# Patient Record
Sex: Male | Born: 1945 | Race: Black or African American | Hispanic: No | Marital: Single | Smoking: Never smoker
Health system: Southern US, Community
[De-identification: ages and names within clinical notes are randomized; demographics above are authoritative.]

## PROBLEM LIST (undated history)

## (undated) DIAGNOSIS — E119 Type 2 diabetes mellitus without complications: Secondary | ICD-10-CM

## (undated) DIAGNOSIS — K219 Gastro-esophageal reflux disease without esophagitis: Secondary | ICD-10-CM

## (undated) DIAGNOSIS — E7800 Pure hypercholesterolemia, unspecified: Secondary | ICD-10-CM

---

## 2021-09-21 ENCOUNTER — Emergency Department
Admission: EM | Admit: 2021-09-21 | Discharge: 2021-09-21 | Disposition: A | Discharge status: Home / Self Care | Payer: Medicare Other | Attending: Emergency Medicine | Admitting: Emergency Medicine

## 2021-09-21 ENCOUNTER — Emergency Department: Payer: Medicare Other

## 2021-09-21 ENCOUNTER — Other Ambulatory Visit: Payer: Self-pay

## 2021-09-21 DIAGNOSIS — R42 Dizziness and giddiness: Secondary | ICD-10-CM | POA: Diagnosis present

## 2021-09-21 LAB — URINALYSIS, ROUTINE W REFLEX MICROSCOPIC
Bilirubin Urine: NEGATIVE
Glucose, UA: NEGATIVE mg/dL
Hgb urine dipstick: NEGATIVE
Ketones, ur: NEGATIVE mg/dL
Leukocytes,Ua: NEGATIVE
Nitrite: NEGATIVE
Protein, ur: NEGATIVE mg/dL
Specific Gravity, Urine: 1.02 (ref 1.005–1.030)
pH: 6.5 (ref 5.0–8.0)

## 2021-09-21 LAB — BASIC METABOLIC PANEL
Anion gap: 9 (ref 5–15)
BUN: 12 mg/dL (ref 8–23)
CO2: 21 mmol/L — ABNORMAL LOW (ref 22–32)
Calcium: 9.6 mg/dL (ref 8.9–10.3)
Chloride: 108 mmol/L (ref 98–111)
Creatinine, Ser: 0.92 mg/dL (ref 0.61–1.24)
GFR, Estimated: >60 mL/min (ref >60)
Glucose, Bld: 113 mg/dL — ABNORMAL HIGH (ref 70–99)
Potassium: 3.8 mmol/L (ref 3.5–5.1)
Sodium: 138 mmol/L (ref 135–145)

## 2021-09-21 LAB — CBC
HCT: 39.9 % (ref 39.0–52.0)
Hemoglobin: 13.2 g/dL (ref 13.0–17.0)
MCH: 31.5 pg (ref 26.0–34.0)
MCHC: 33.1 g/dL (ref 30.0–36.0)
MCV: 95.2 fL (ref 80.0–100.0)
Platelets: 229 10*3/uL (ref 150–400)
RBC: 4.19 MIL/uL — ABNORMAL LOW (ref 4.22–5.81)
RDW: 13.6 % (ref 11.5–15.5)
WBC: 6.8 10*3/uL (ref 4.0–10.5)
nRBC: 0 % (ref 0.0–0.2)

## 2021-09-21 IMAGING — MR MR HEAD W/O CM
12 series · 48 of 48 positions shown · non-contrast
Comparison: None.

CLINICAL DATA: Nonspecific dizziness.  Room spinning for 3 days.

EXAM:
MRI HEAD WITHOUT CONTRAST
TECHNIQUE: Multiplanar, multiecho pulse sequences of the brain and surrounding
structures were obtained without intravenous contrast.

[Series 5: ax dwi_tracew · axial · 3.0mm · 0.65mm/px · z∈[-113,+55]mm · 4 of 52 slices shown]
[im 1/52]
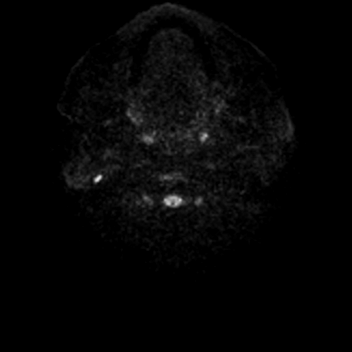
[im 18/52]
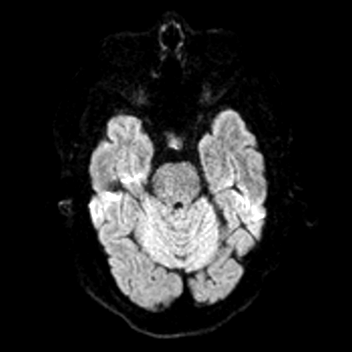
[im 35/52]
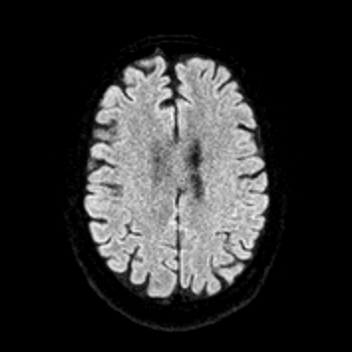
[im 52/52]
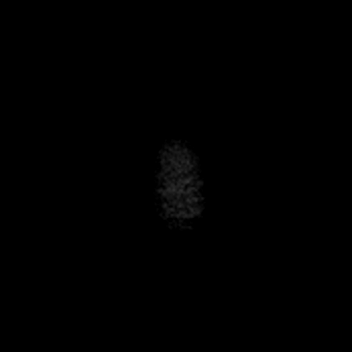

[Series 6: ax dwi_adc · axial · 3.0mm · 0.65mm/px · z∈[-113,+55]mm · 4 of 52 slices shown]
[im 1/52]
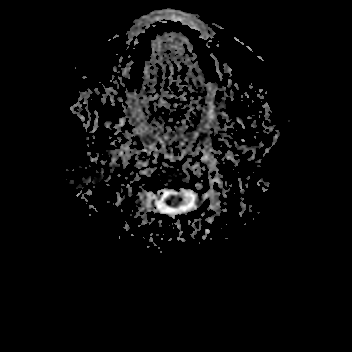
[im 18/52]
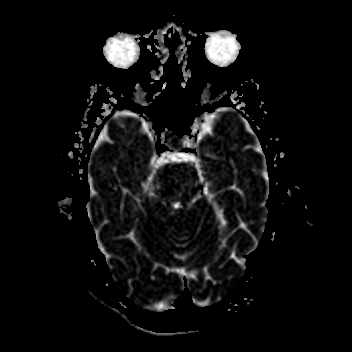
[im 35/52]
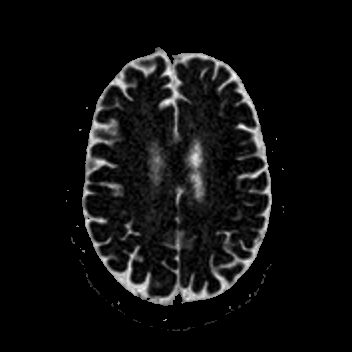
[im 52/52]
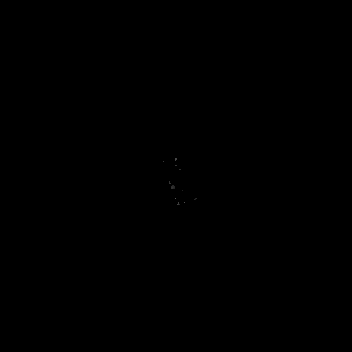

[Series 7: cor dwi_tracew · coronal · 5.0mm · 0.65mm/px · 3 of 42 slices shown]
[im 1/42]
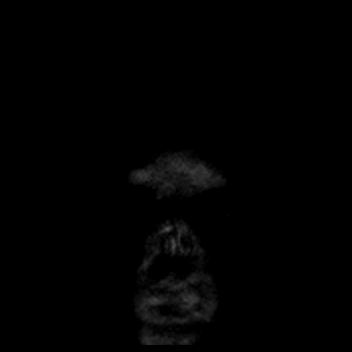
[im 21/42]
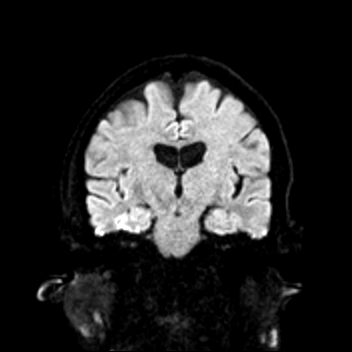
[im 42/42]
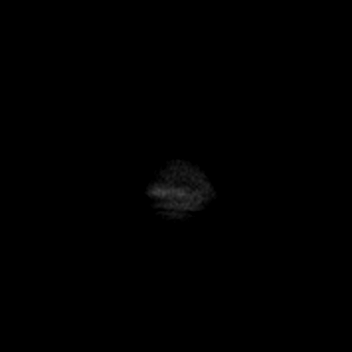

[Series 8: cor dwi_adc · coronal · 5.0mm · 0.65mm/px · 3 of 40 slices shown]
[im 1/40]
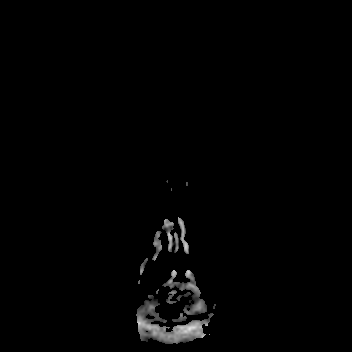
[im 20/40]
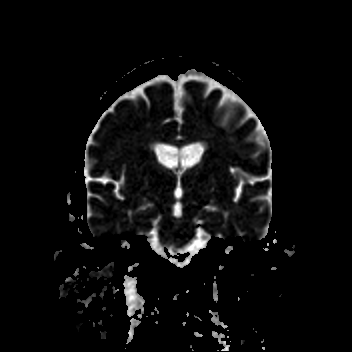
[im 40/40]
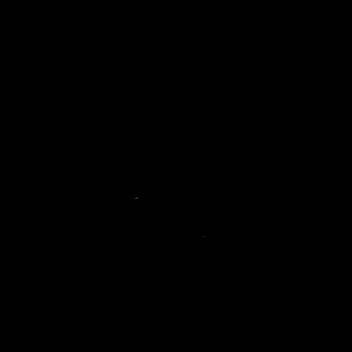

[Series 9: T1 · sagittal · 5.0mm · 0.62mm/px · 2 of 23 slices shown (1 of 2)]
[im 1/23]
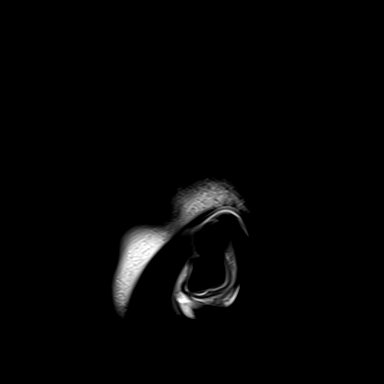
[im 23/23]
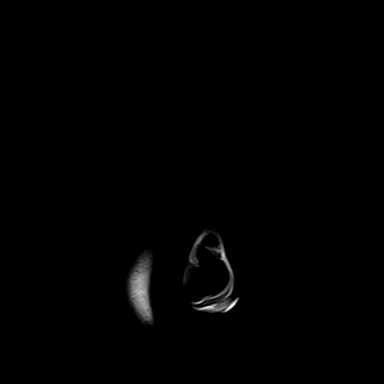

[Series 10: T2 · axial · 5.0mm · 0.53mm/px · z∈[-101,+55]mm · 2 of 27 slices shown (1 of 2)]
[im 1/27]
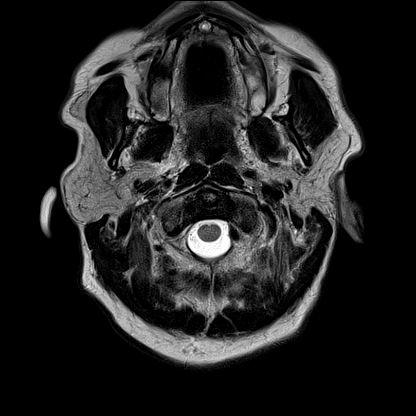
[im 27/27]
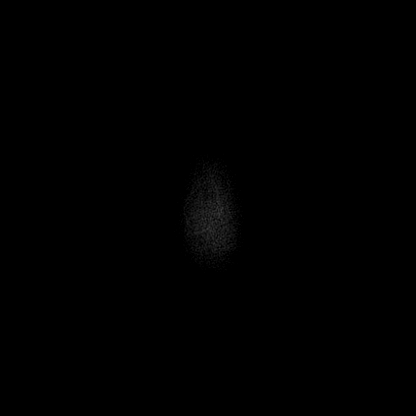

[Series 11: mag_images · axial · 3.0mm · 0.90mm/px · z∈[-111,+66]mm · 4 of 60 slices shown]
[im 1/60]
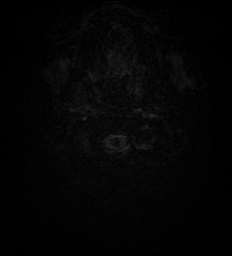
[im 20/60]
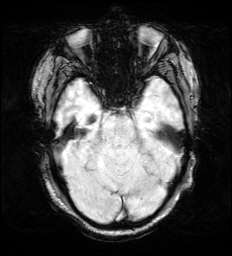
[im 40/60]
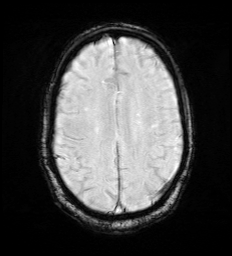
[im 60/60]
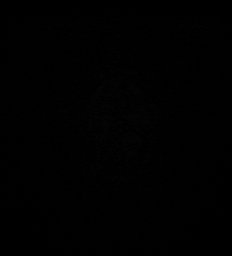

[Series 12: pha_images · axial · 3.0mm · 0.90mm/px · z∈[-111,+66]mm · 4 of 60 slices shown]
[im 1/60]
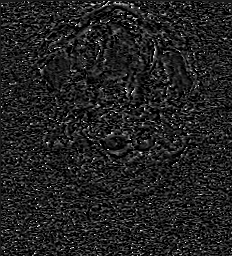
[im 20/60]
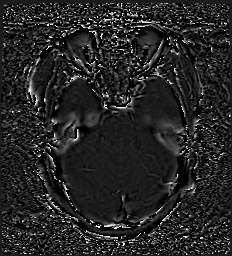
[im 40/60]
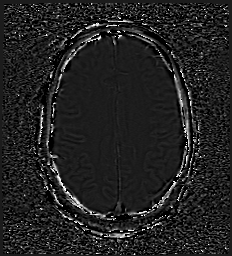
[im 60/60]
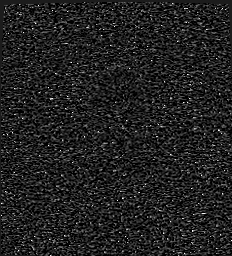

[Series 13: swi_images · axial · 3.0mm · 0.90mm/px · z∈[-111,+66]mm · 4 of 60 slices shown]
[im 1/60]
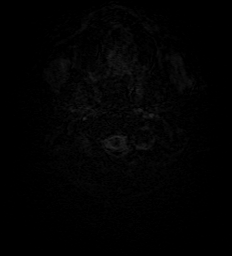
[im 20/60]
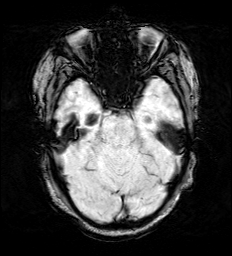
[im 40/60]
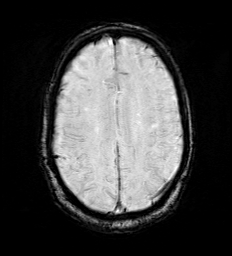
[im 60/60]
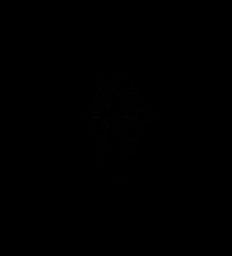

[Series 15: FLAIR · axial · 3.0mm · 0.53mm/px · z∈[-104,+58]mm · 4 of 55 slices shown]
[im 1/55]
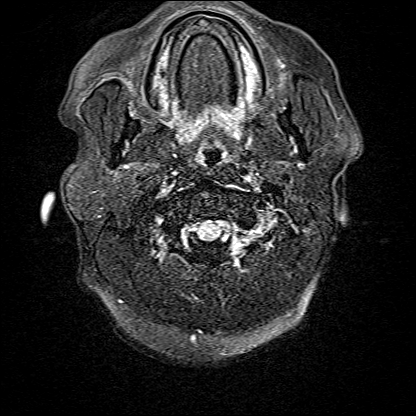
[im 19/55]
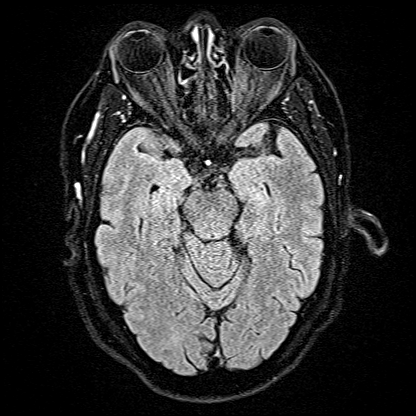
[im 37/55]
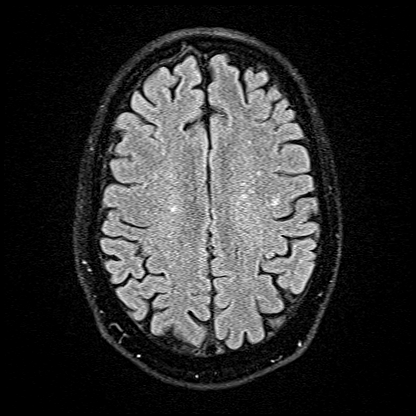
[im 55/55]
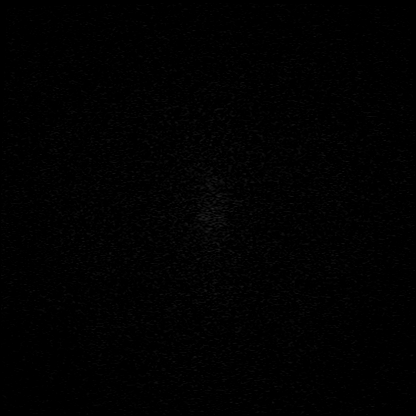

[Series 16: T1 · axial · 1.0mm · 0.98mm/px · z∈[-113,+62]mm · 12 of 176 slices shown (2 of 2)]
[im 1/176]
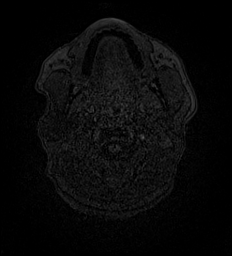
[im 16/176]
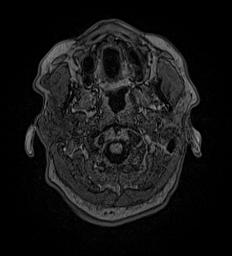
[im 32/176]
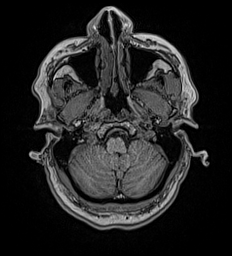
[im 48/176]
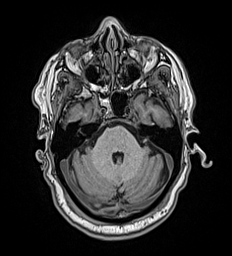
[im 64/176]
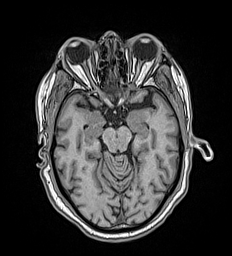
[im 80/176]
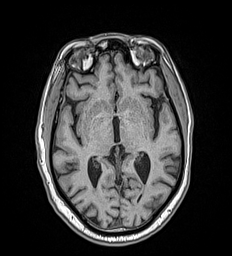
[im 96/176]
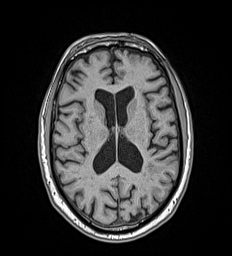
[im 112/176]
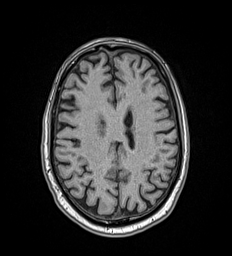
[im 128/176]
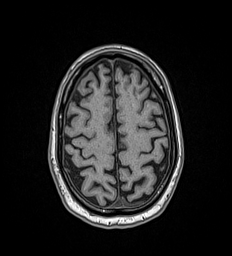
[im 144/176]
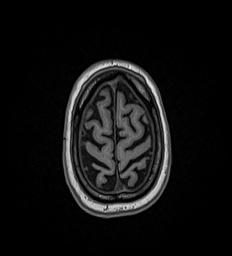
[im 160/176]
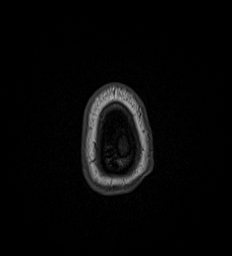
[im 176/176]
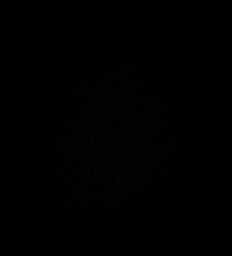

[Series 17: T2 · coronal · 5.0mm · 0.57mm/px · 2 of 31 slices shown (2 of 2)]
[im 1/31]
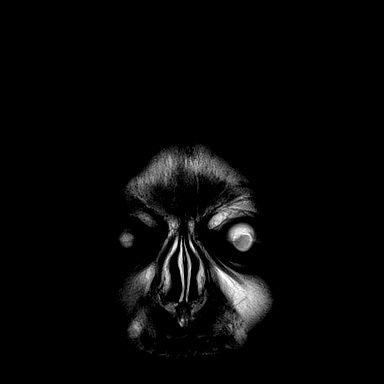
[im 31/31]
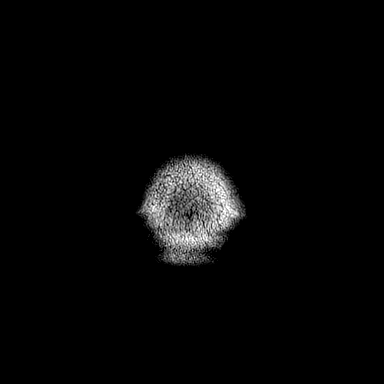

[48 of 48 positions shown; findings below may reference images not displayed]

FINDINGS: Brain: No acute infarction, hemorrhage, hydrocephalus, extra-axial
collection or mass lesion. Mild FLAIR hyperintensity in the cerebral
white matter, asymmetric to the left hemisphere. This is most likely
chronic small vessel ischemia given patient age and mild extent.
Brain volume is normal.Chronic mineralization in the right
cerebellum which is non specific in isolation.

Vascular: Normal flow voids.

Skull and upper cervical spine: Normal marrow signal.

Sinuses/Orbits: Negative.
IMPRESSION: 1. No acute finding.
2. Mild chronic white matter disease.

## 2021-09-21 IMAGING — CT CT HEAD W/O CM
4 series · 17 of 47 positions shown, 19 images · non-contrast
Comparison: None.

CLINICAL DATA: Mental status change



[Series 2: head wo · axial · 0.45mm/px · z∈[-75,+45]mm · 7 of 32 slices shown, 9 images]
[im 4/32  brain]
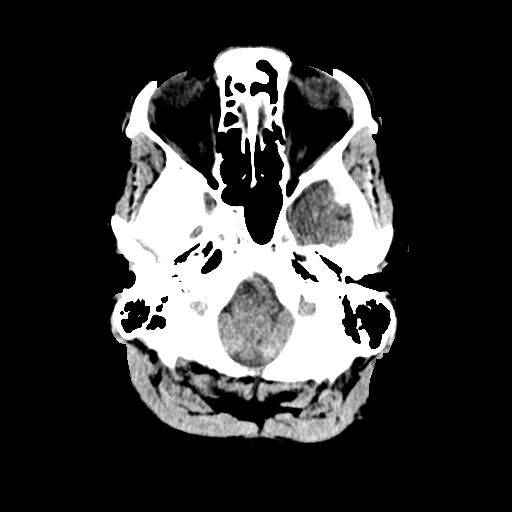
[im 4/32  bone]
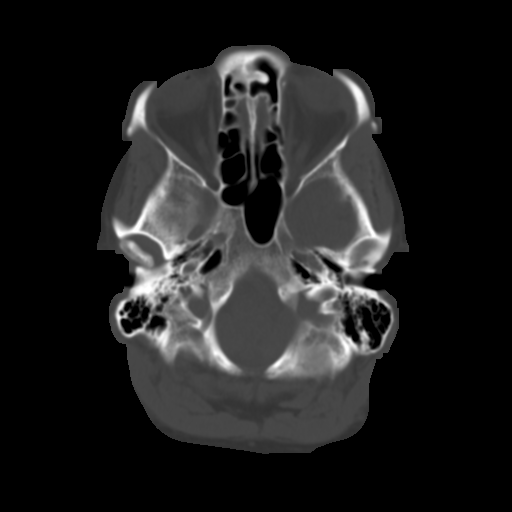
[im 8/32  brain]
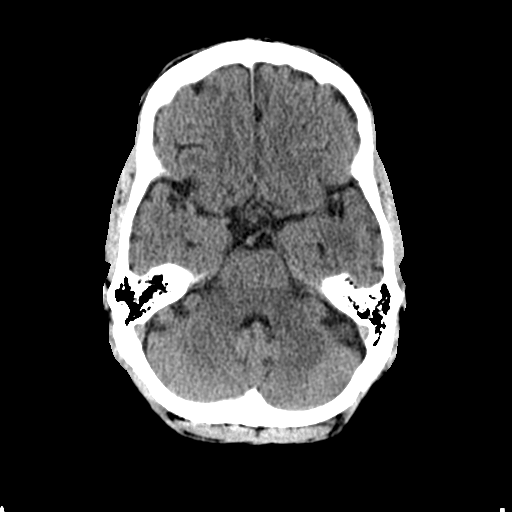
[im 12/32  brain]
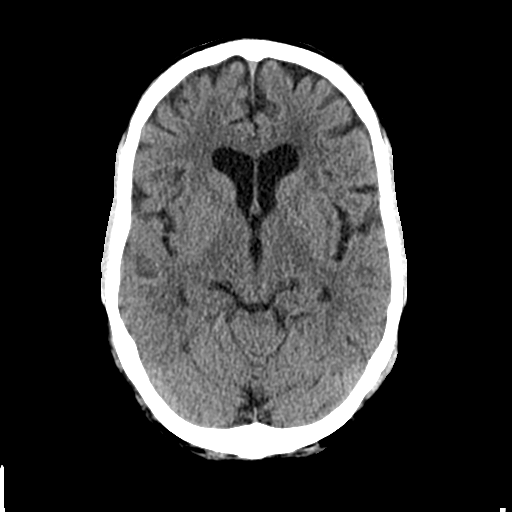
[im 16/32  brain]
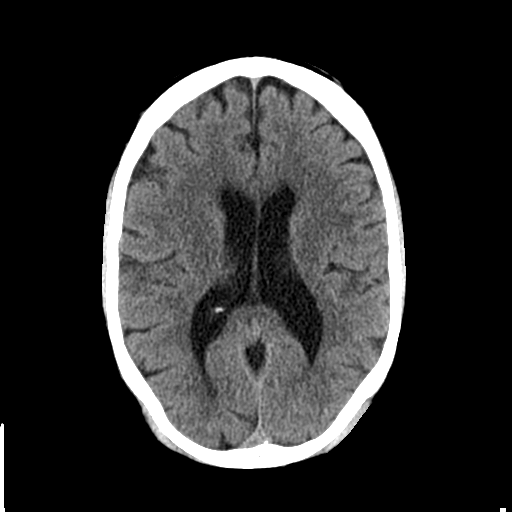
[im 20/32  brain]
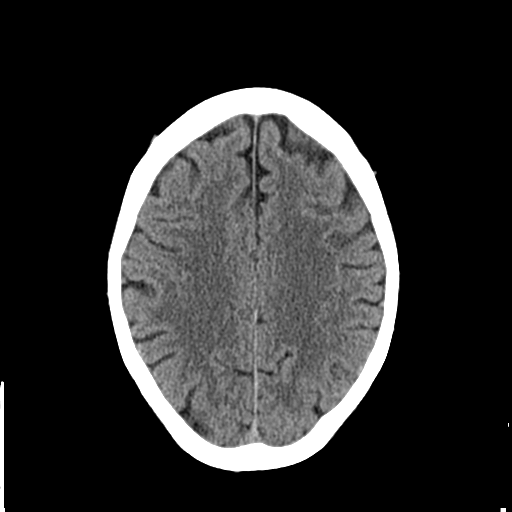
[im 20/32  bone]
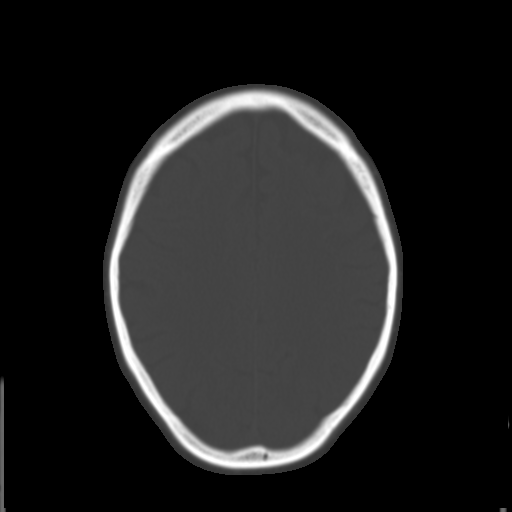
[im 24/32  brain]
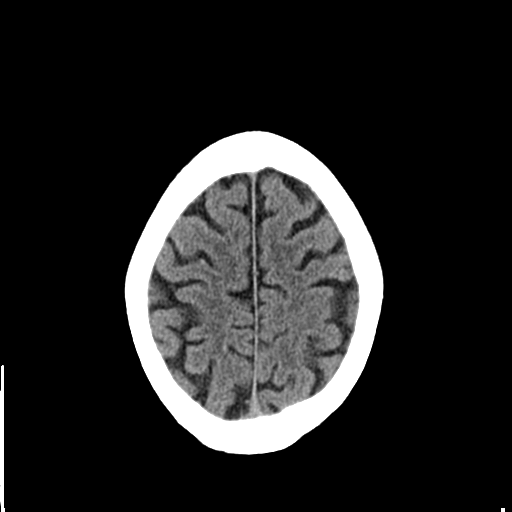
[im 28/32  brain]
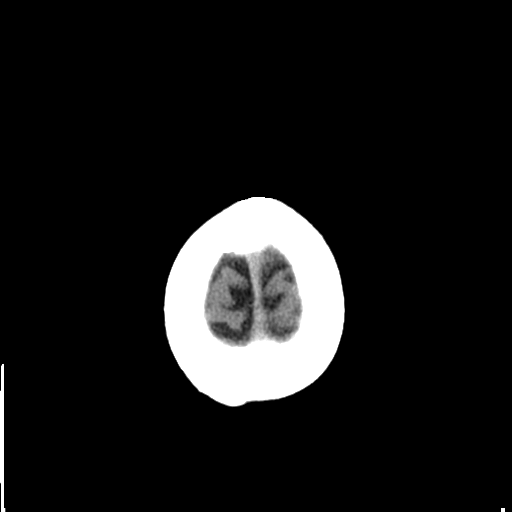

[Series 3: head bone · axial · 0.45mm/px · z∈[-76,-20]mm · 4 of 79 slices shown]
[im 8/79  bone]
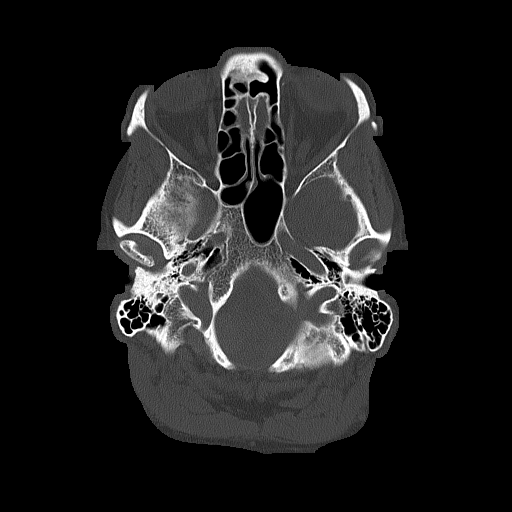
[im 16/79  bone]
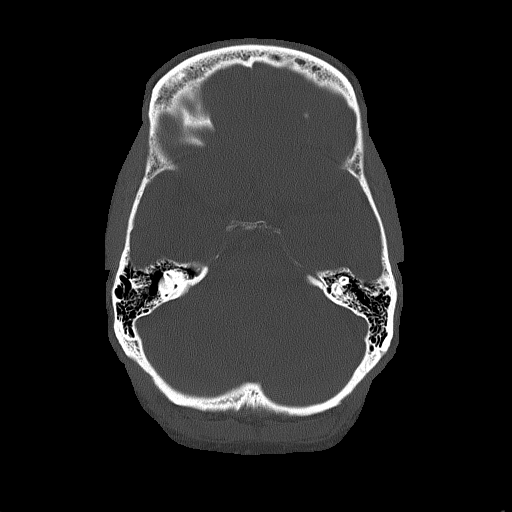
[im 24/79  bone]
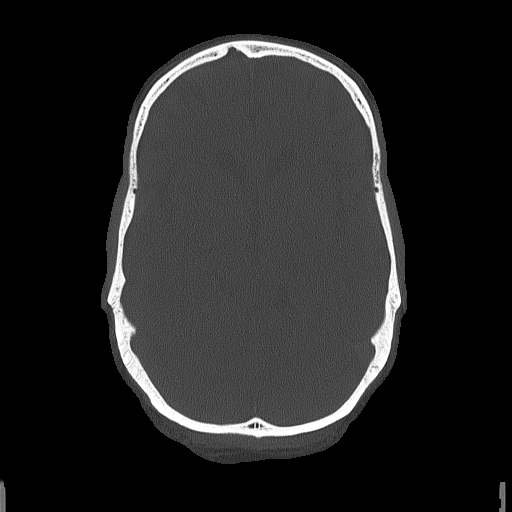
[im 36/79  bone]
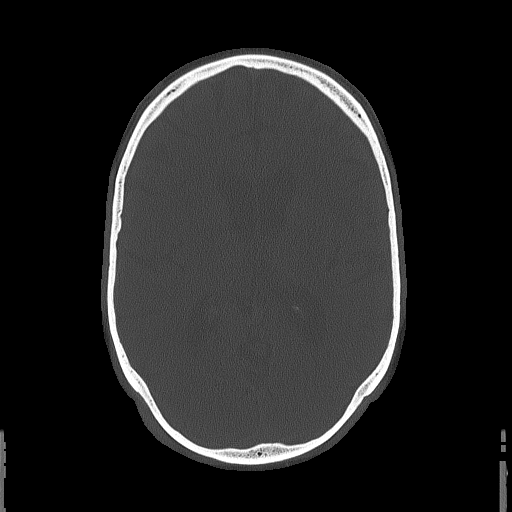

[Series 4: coronal soft tissue · coronal · 0.35mm/px · 3 of 69 slices shown]
[im 23/69  brain]
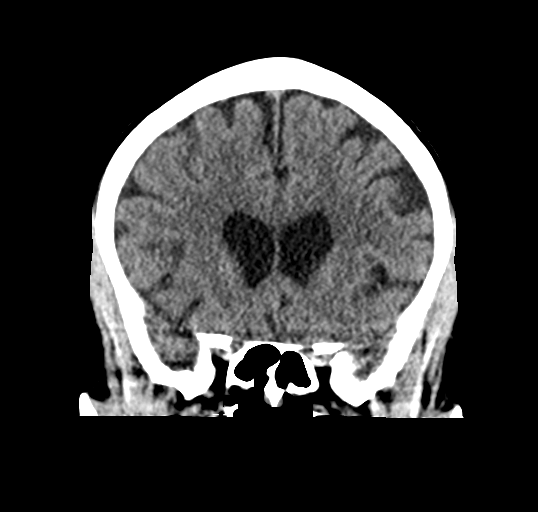
[im 31/69  brain]
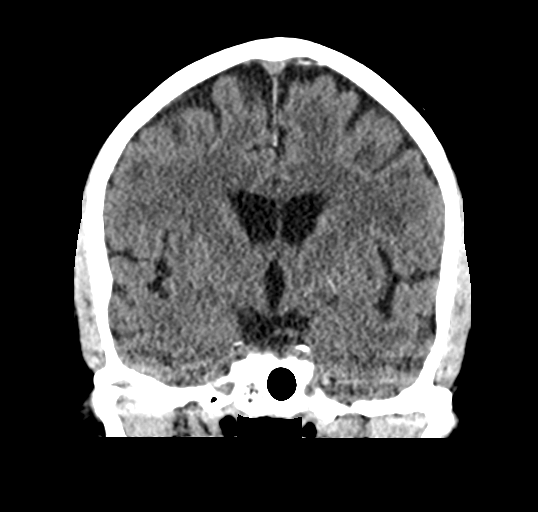
[im 38/69  brain]
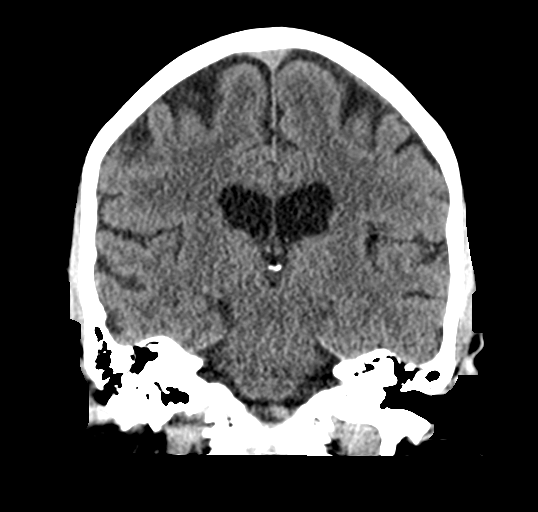

[Series 5: sagittal soft tissue · sagittal · 0.35mm/px · 3 of 53 slices shown]
[im 18/53  brain]
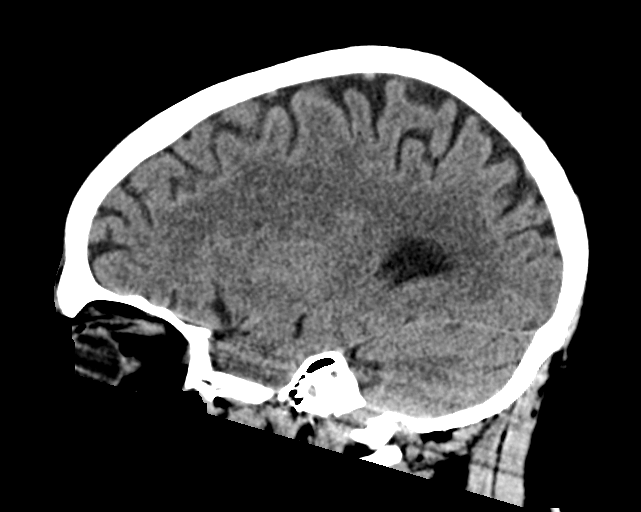
[im 27/53  brain]
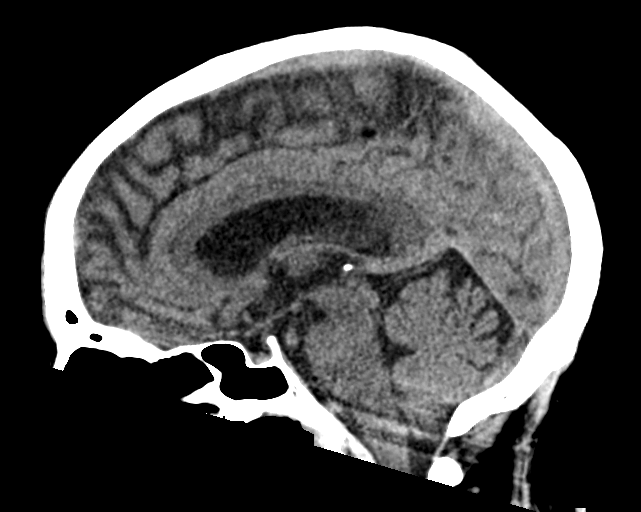
[im 35/53  brain]
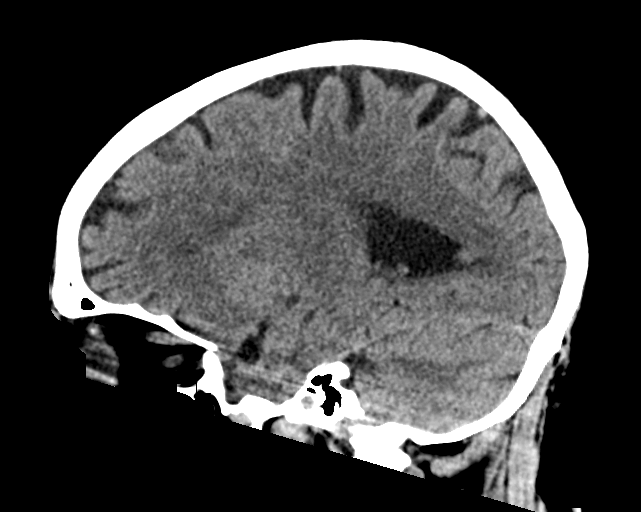

[17 of 47 positions shown; findings below may reference images not displayed]

FINDINGS: Brain: Mild age related global atrophy. No evidence of acute
infarction, hemorrhage, hydrocephalus, extra-axial collection or
mass lesion/mass effect.

Vascular: No hyperdense vessel or unexpected calcification.

Skull: Normal. Negative for fracture or focal lesion.

Sinuses/Orbits: No acute finding.

Other: None.
IMPRESSION: No acute intracranial abnormality.

## 2021-09-21 MED ORDER — MECLIZINE HCL 25 MG PO TABS
25.0000 mg | ORAL_TABLET | Freq: Once | ORAL | Status: AC
  Administered 2021-09-21: 25 mg via ORAL
  Filled 2021-09-21: qty 1

## 2021-09-21 MED ORDER — MECLIZINE HCL 25 MG PO TABS: 25.0000 mg | ORAL_TABLET | Freq: Three times a day (TID) | ORAL | 0 refills | Status: DC | PRN

## 2021-09-21 NOTE — ED Provider Notes (Signed)
[facility] Provider Note    Event Date/Time   First MD Initiated Contact with Patient 09/21/21 [phone]     (approximate)  History   Chief Complaint: Dizziness  HPI  Alex Lin is a 75 y.o. male presents to the emergency department for dizziness.  According to the patient over the past 2 days he has been experiencing intermittent episodes of significant dizziness which she describes as a room spinning sensation.  Patient denies any headache weakness numbness confusion or slurred speech.  States the episodes are somewhat intermittent and seem to be provoked by movement.  Patient states he has never been diagnosed with vertigo but states he has been told he has a "equilibrium" issue in the past that presented very similarly.  Physical Exam   Triage Vital Signs: ED Triage Vitals  Enc Vitals Group     BP 09/21/21 0924 137/88     Pulse Rate 09/21/21 0924 67     Resp 09/21/21 0924 20     Temp 09/21/21 0924 98.8 F (37.1 C)     Temp src --      SpO2 09/21/21 0924 98 %     Weight --      Height --      Head Circumference --      Peak Flow --      Pain Score 09/21/21 0946 8     Pain Loc --      Pain Edu? --      Excl. in GC? --     Most recent vital signs: Vitals:   09/21/21 0924 09/21/21 0946  BP: 137/88 (!) 158/83  Pulse: 67 (!) 59  Resp: 20 16  Temp: 98.8 F (37.1 C)   SpO2: 98% 99%    General: Awake, no distress.  CV:  Good peripheral perfusion.  Regular rate and rhythm  Resp:  Normal effort.  Equal breath sounds bilaterally.  Abd:  No distention.  Soft, nontender.  No rebound or guarding. Other:  Equal grip strengths bilaterally.  Cranial nerves intact.   ED Results / Procedures / Treatments   EKG  EKG viewed and interpreted by myself shows a normal sinus rhythm at 70 bpm with a narrow QRS, normal axis, normal intervals, no concerning ST changes.  RADIOLOGY  I have personally reviewed the CT images the head, no acute abnormality  my evaluation. Radiology is read the CT is negative. MRI is negative   MEDICATIONS ORDERED IN ED: Medications - No data to display   IMPRESSION / MDM / ASSESSMENT AND PLAN / ED COURSE  I reviewed the triage vital signs and the nursing notes.  Patient presents to the emergency department for dizziness over the past 2 days but worse this morning.  [facility] to [facility] and was referred to the emergency department.  Patient states at times he has difficulty walking and feels unsteady which he describes as being off balance.  States negative flu and COVID test done at [facility] earlier today.  Patient is differential would include vertigo, electrolyte or metabolic abnormality, dehydration, CVA.  We will check labs, IV hydrate.  I discussed with the patient pros and cons of proceeding with MRI patient is agreeable.  We will MRI to rule out CVA.  Patient's labs are reassuring.  Urinalysis is normal.  Chemistry is normal including normal renal function.  Normal white blood cell count and blood counts.  Patient CT scan of the head is negative, MRI of the brain  is negative.  Highly suspect vertigo to be the cause of the patient's dizziness.  He states he is feeling much better after meclizine.  We will discharge with the same have the patient follow-up with his doctor.  I discussed return precautions.  FINAL CLINICAL IMPRESSION(S) / ED DIAGNOSES   Dizziness  Rx / DC Orders   Meclizine  Note:  This document was prepared using Dragon voice recognition software and may include unintentional dictation errors.   Minna Antis, MD 09/21/21 1413

## 2021-09-21 NOTE — ED Triage Notes (Addendum)
Pt sent from [facility] with c/o dizziness with HA and nausea for the past 3-4 days. Pt has an unsteady gait on arrival, denies any visual changes.  Had a negative flu and covid test today

## 2021-09-21 NOTE — ED Notes (Signed)
Pt to CT

## 2021-09-21 NOTE — ED Notes (Signed)
Phone provided for MRI screening. 

## 2021-09-21 NOTE — ED Notes (Signed)
Dr. Lenard Lance at bedside with pt. Pt advised he has been having dizziness and the room was spinning for the past 3 days. The pt denied hx of vertigo, recent falls, or illness. The pt c/o a headache to the front of his forehead and a cough with clear sputum. Wife at bedside.

## 2021-09-21 NOTE — ED Notes (Signed)
Complains of H/A. Still a little dizzy

## 2022-02-07 ENCOUNTER — Other Ambulatory Visit: Payer: Self-pay

## 2022-02-07 ENCOUNTER — Emergency Department: Payer: Medicare Other

## 2022-02-07 ENCOUNTER — Emergency Department
Admission: EM | Admit: 2022-02-07 | Discharge: 2022-02-07 | Disposition: A | Discharge status: Home / Self Care | Payer: Medicare Other | Attending: Student in an Organized Health Care Education/Training Program | Admitting: Student in an Organized Health Care Education/Training Program

## 2022-02-07 DIAGNOSIS — R531 Weakness: Secondary | ICD-10-CM | POA: Insufficient documentation

## 2022-02-07 DIAGNOSIS — R42 Dizziness and giddiness: Secondary | ICD-10-CM | POA: Diagnosis present

## 2022-02-07 HISTORY — DX: Type 2 diabetes mellitus without complications: E11.9

## 2022-02-07 HISTORY — DX: Gastro-esophageal reflux disease without esophagitis: K21.9

## 2022-02-07 HISTORY — DX: Pure hypercholesterolemia, unspecified: E78.00

## 2022-02-07 LAB — CBC WITH DIFFERENTIAL/PLATELET
Abs Immature Granulocytes: 0.02 10*3/uL (ref 0.00–0.07)
Basophils Absolute: 0.1 10*3/uL (ref 0.0–0.1)
Basophils Relative: 1 %
Eosinophils Absolute: 0.3 10*3/uL (ref 0.0–0.5)
Eosinophils Relative: 4 %
HCT: 39.6 % (ref 39.0–52.0)
Hemoglobin: 12.9 g/dL — ABNORMAL LOW (ref 13.0–17.0)
Immature Granulocytes: 0 %
Lymphocytes Relative: 35 %
Lymphs Abs: 2.4 10*3/uL (ref 0.7–4.0)
MCH: 30.3 pg (ref 26.0–34.0)
MCHC: 32.6 g/dL (ref 30.0–36.0)
MCV: 93 fL (ref 80.0–100.0)
Monocytes Absolute: 0.5 10*3/uL (ref 0.1–1.0)
Monocytes Relative: 7 %
Neutro Abs: 3.6 10*3/uL (ref 1.7–7.7)
Neutrophils Relative %: 53 %
Platelets: 273 10*3/uL (ref 150–400)
RBC: 4.26 MIL/uL (ref 4.22–5.81)
RDW: 14.6 % (ref 11.5–15.5)
WBC: 6.8 10*3/uL (ref 4.0–10.5)
nRBC: 0 % (ref 0.0–0.2)

## 2022-02-07 LAB — URINALYSIS, ROUTINE W REFLEX MICROSCOPIC
Bilirubin Urine: NEGATIVE
Glucose, UA: NEGATIVE mg/dL
Hgb urine dipstick: NEGATIVE
Ketones, ur: NEGATIVE mg/dL
Leukocytes,Ua: NEGATIVE
Nitrite: NEGATIVE
Protein, ur: NEGATIVE mg/dL
Specific Gravity, Urine: 1.006 (ref 1.005–1.030)
pH: 5 (ref 5.0–8.0)

## 2022-02-07 LAB — COMPREHENSIVE METABOLIC PANEL
ALT: 30 U/L (ref 0–44)
AST: 39 U/L (ref 15–41)
Albumin: 4.1 g/dL (ref 3.5–5.0)
Alkaline Phosphatase: 74 U/L (ref 38–126)
Anion gap: 9 (ref 5–15)
BUN: 18 mg/dL (ref 8–23)
CO2: 22 mmol/L (ref 22–32)
Calcium: 9.4 mg/dL (ref 8.9–10.3)
Chloride: 107 mmol/L (ref 98–111)
Creatinine, Ser: 0.99 mg/dL (ref 0.61–1.24)
GFR, Estimated: >60 mL/min (ref >60)
Glucose, Bld: 172 mg/dL — ABNORMAL HIGH (ref 70–99)
Potassium: 3.8 mmol/L (ref 3.5–5.1)
Sodium: 138 mmol/L (ref 135–145)
Total Bilirubin: 0.6 mg/dL (ref 0.3–1.2)
Total Protein: 7.5 g/dL (ref 6.5–8.1)

## 2022-02-07 IMAGING — CT CT HEAD W/O CM
4 series · 16 of 47 positions shown, 18 images · non-contrast
Comparison: Head CT [DATE].

CLINICAL DATA: 76-year-old male with history of dizziness for the
past 3-4 days.



[Series 3: head bone · axial · 0.43mm/px · z∈[-100,-68]mm · 3 of 83 slices shown]
[im 9/83  bone]
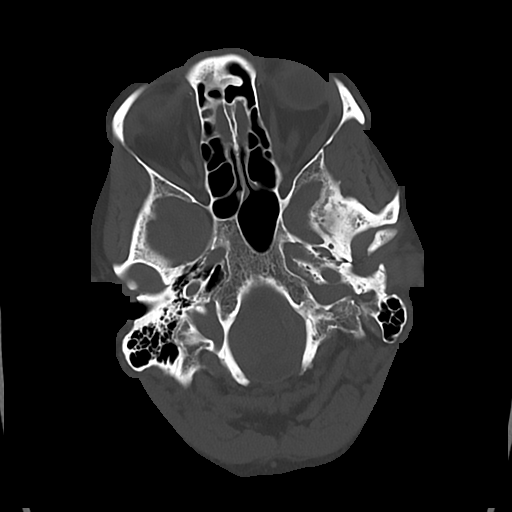
[im 17/83  bone]
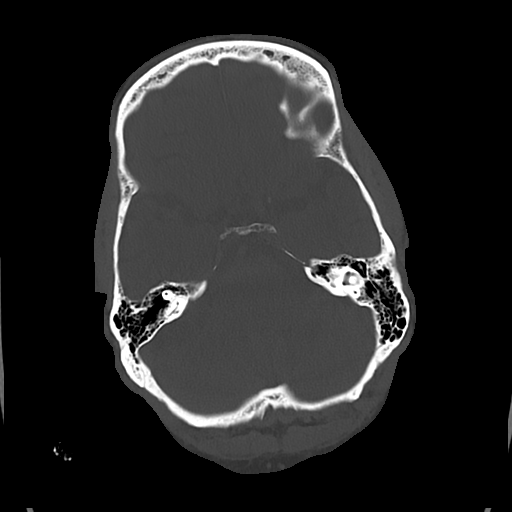
[im 25/83  bone]
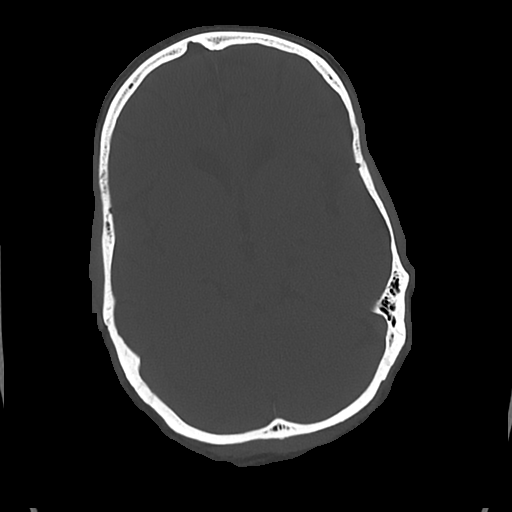

[Series 4: head wo · axial · 0.43mm/px · z∈[-96,+24]mm · 7 of 33 slices shown, 9 images]
[im 5/33  brain]
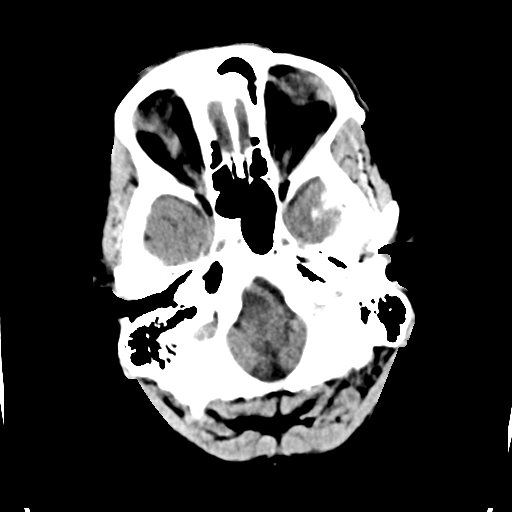
[im 5/33  bone]
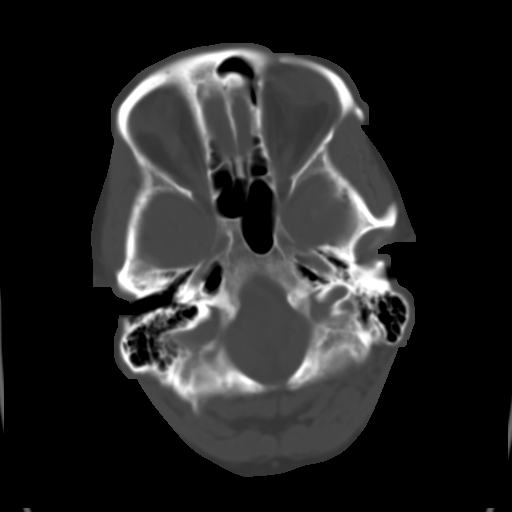
[im 9/33  brain]
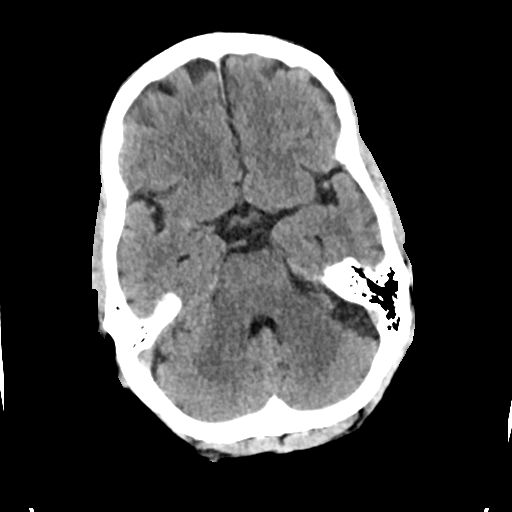
[im 13/33  brain]
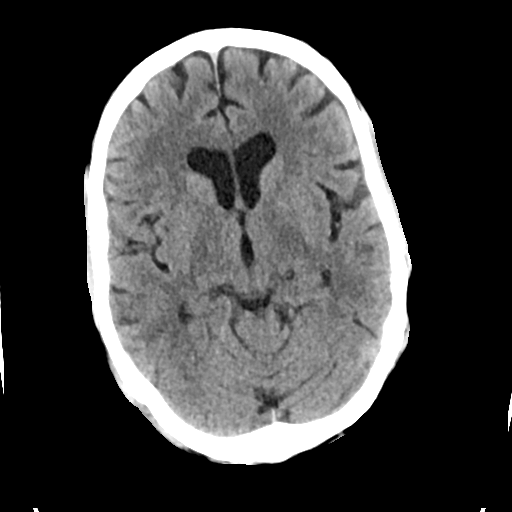
[im 17/33  brain]
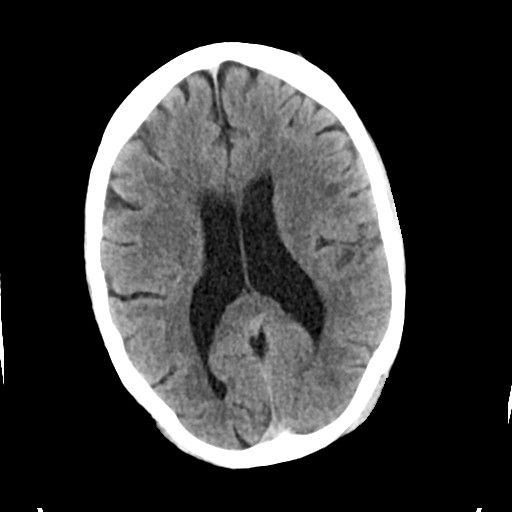
[im 21/33  brain]
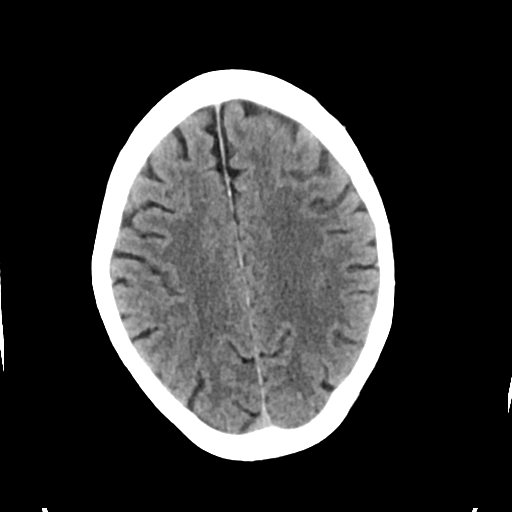
[im 21/33  bone]
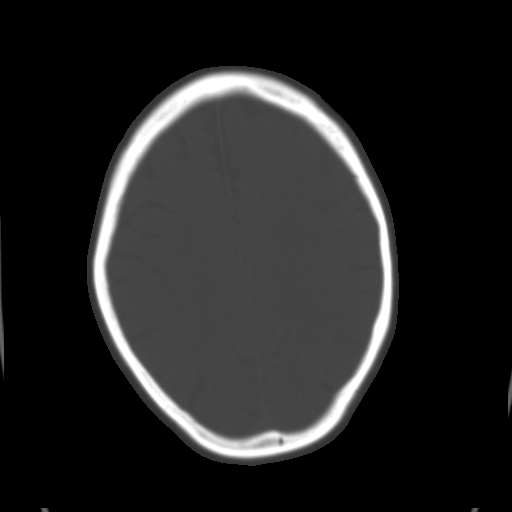
[im 25/33  brain]
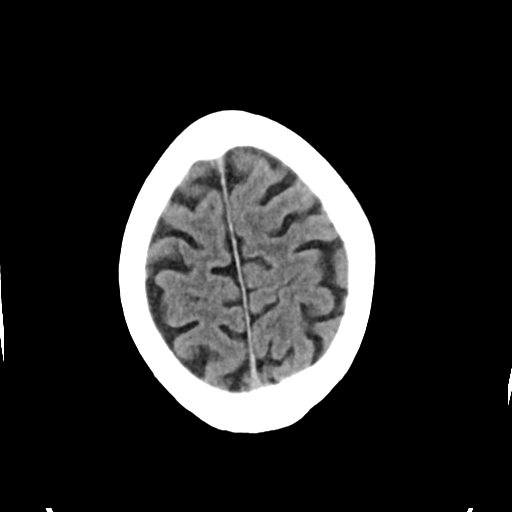
[im 29/33  brain]
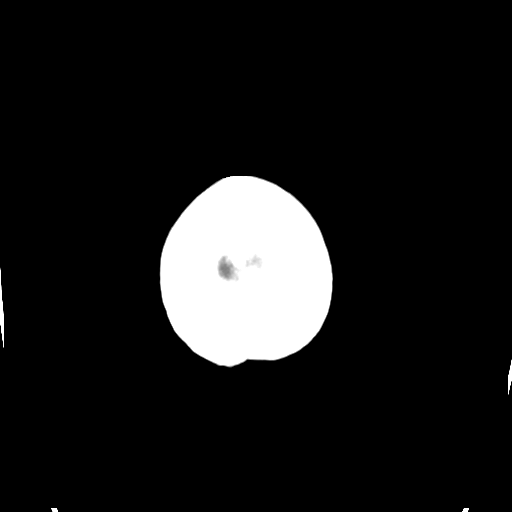

[Series 5: cor soft · coronal · 0.32mm/px · 3 of 70 slices shown]
[im 24/70  brain]
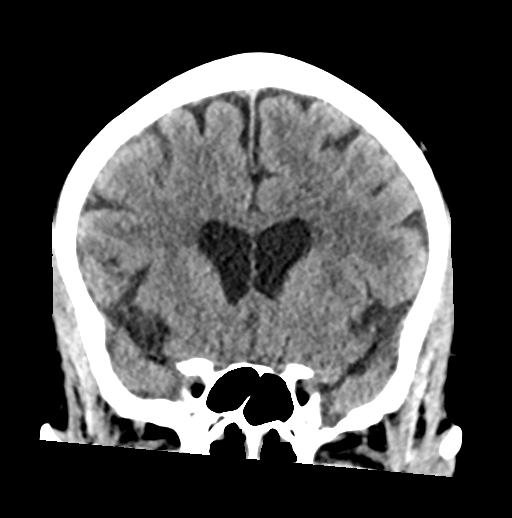
[im 31/70  brain]
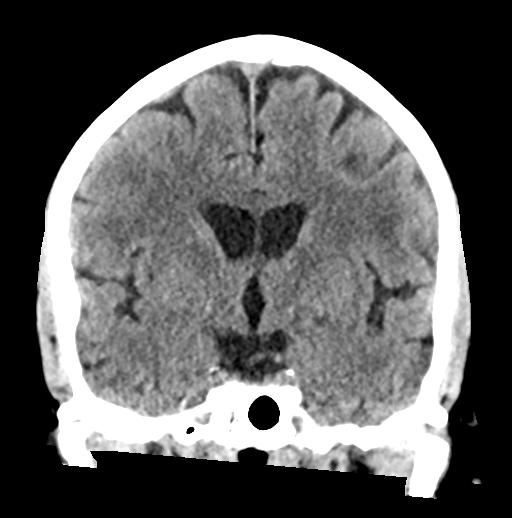
[im 39/70  brain]
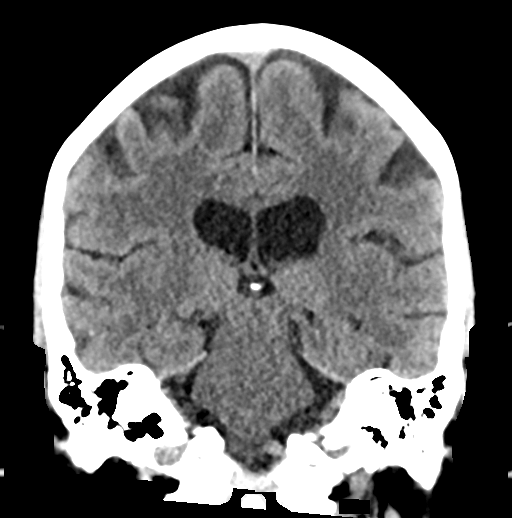

[Series 6: sag soft · sagittal · 0.32mm/px · 3 of 53 slices shown]
[im 18/53  brain]
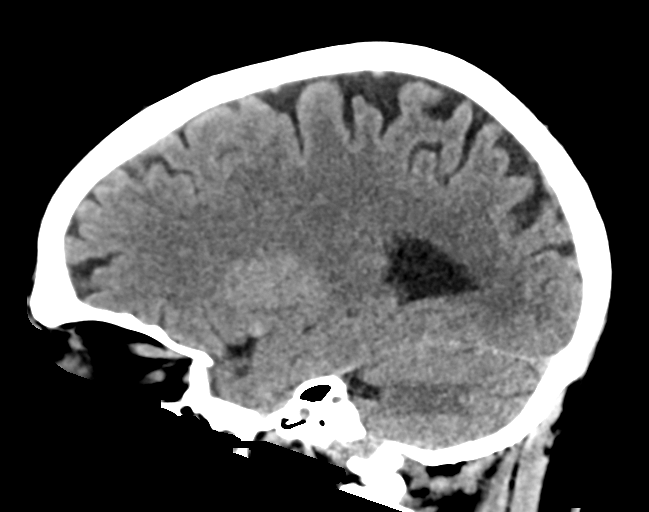
[im 27/53  brain]
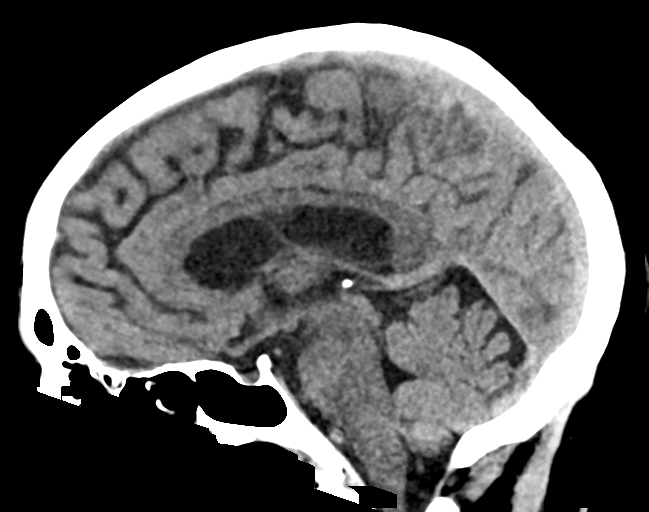
[im 35/53  brain]
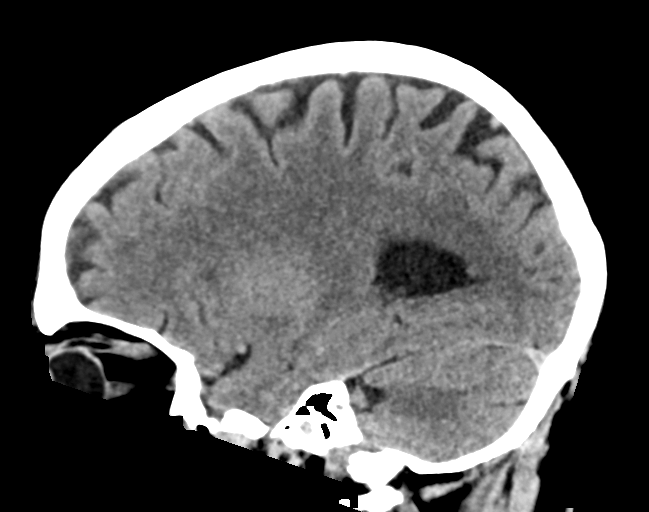

[16 of 47 positions shown; findings below may reference images not displayed]

FINDINGS: Brain: Patchy areas of decreased attenuation are noted throughout
the deep and periventricular white matter of the cerebral
hemispheres bilaterally, compatible with mild chronic microvascular
ischemic disease. No evidence of acute infarction, hemorrhage,
hydrocephalus, extra-axial collection or mass lesion/mass effect.

Vascular: No hyperdense vessel or unexpected calcification.

Skull: Normal. Negative for fracture or focal lesion.

Sinuses/Orbits: No acute finding.

Other: None.
IMPRESSION: 1. No acute intracranial abnormalities.
2. Mild chronic microvascular ischemic changes in the cerebral white
matter redemonstrated, as above.

## 2022-02-07 IMAGING — MR MR HEAD W/O CM
12 series · 42 of 48 positions shown · non-contrast
Comparison: CT head from the same day.  MRI [DATE].

CLINICAL DATA: Neuro deficit, acute, stroke suspected

EXAM:
MRI HEAD WITHOUT CONTRAST
TECHNIQUE: Multiplanar, multiecho pulse sequences of the brain and surrounding
structures were obtained without intravenous contrast.

[Series 5: ax dwi_tracew · axial · 3.0mm · 0.65mm/px · z∈[-113,+41]mm · 4 of 48 slices shown]
[im 1/48]
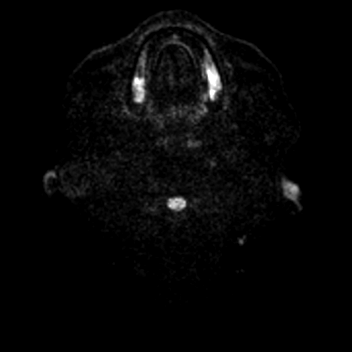
[im 16/48]
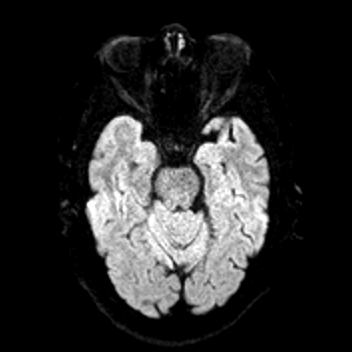
[im 32/48]
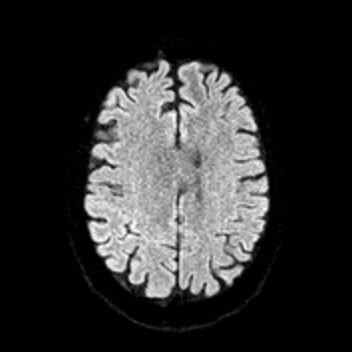
[im 48/48]
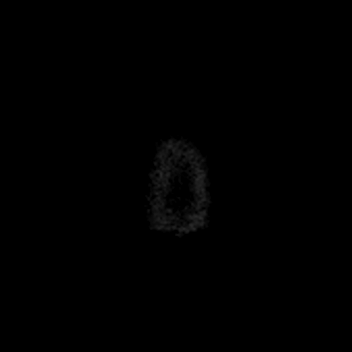

[Series 6: ax dwi_adc · axial · 3.0mm · 0.65mm/px · z∈[-113,+35]mm · 3 of 46 slices shown]
[im 1/46]
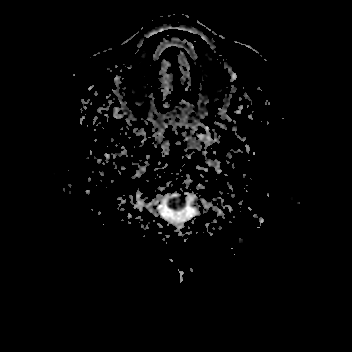
[im 23/46]
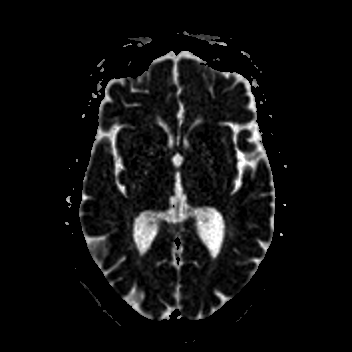
[im 46/46]
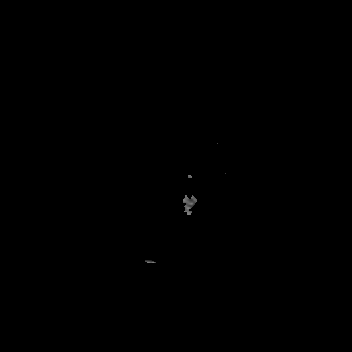

[Series 7: cor dwi_tracew · coronal · 5.0mm · 0.68mm/px · 3 of 40 slices shown]
[im 1/40]
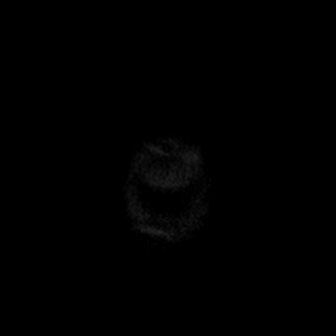
[im 20/40]
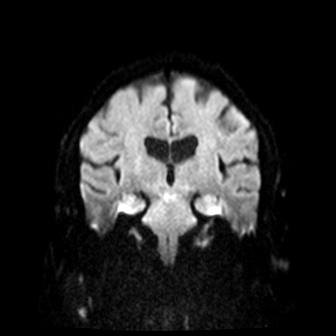
[im 40/40]
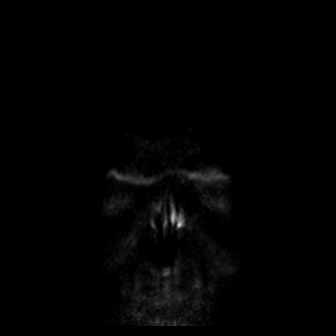

[Series 8: cor dwi_adc · coronal · 5.0mm · 0.68mm/px · 3 of 40 slices shown]
[im 1/40]
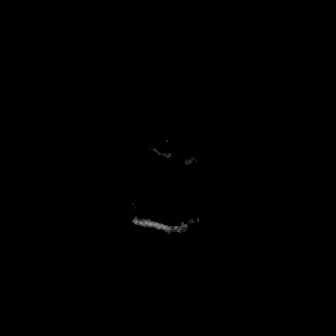
[im 20/40]
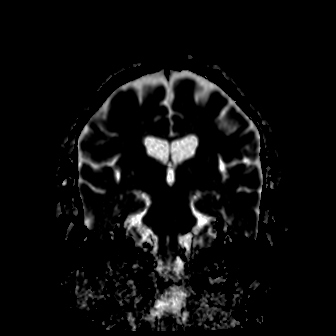
[im 40/40]
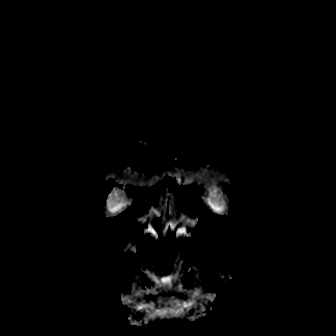

[Series 9: T1 · sagittal · 5.0mm · 0.62mm/px · 1 of 21 slices shown (1 of 2)]
[im 1/21]
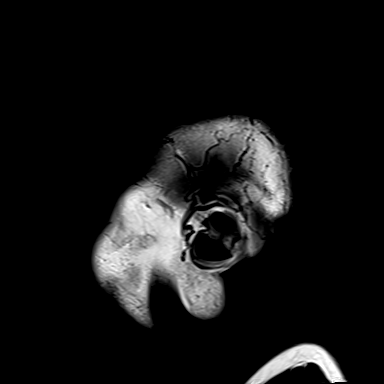

[Series 10: T2 · axial · 5.0mm · 0.53mm/px · z∈[-113,+42]mm · 2 of 27 slices shown (1 of 2)]
[im 1/27]
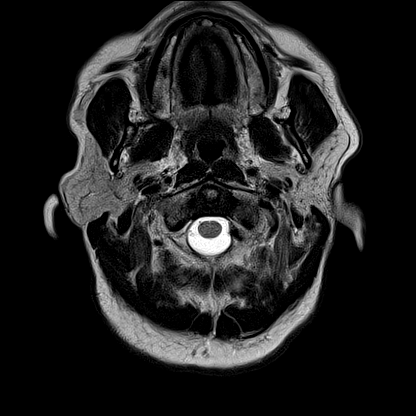
[im 27/27]
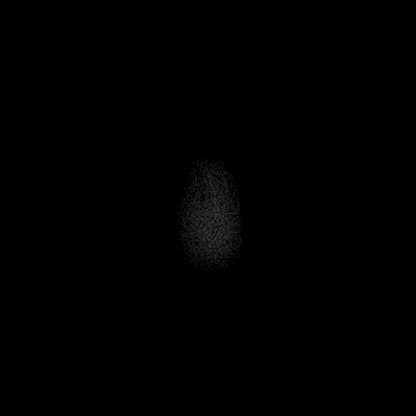

[Series 12: ax swi_pha · axial · 2.0mm · 0.90mm/px · z∈[-113,+44]mm · 5 of 80 slices shown]
[im 1/80]
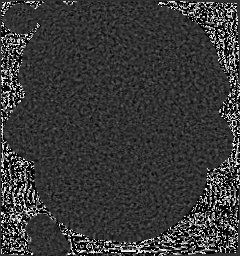
[im 20/80]
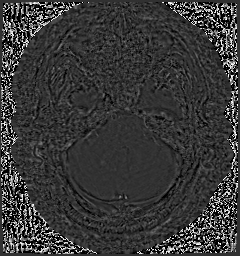
[im 40/80]
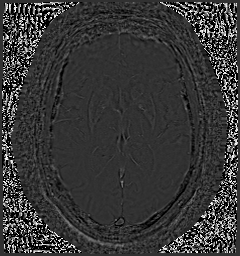
[im 60/80]
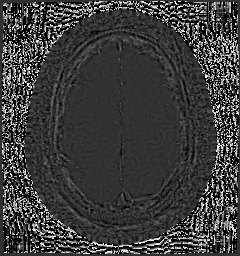
[im 80/80]
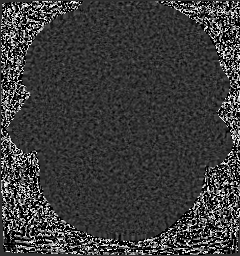

[Series 13: ax swi_swi · axial · 2.0mm · 0.90mm/px · z∈[-113,+44]mm · 5 of 80 slices shown]
[im 1/80]
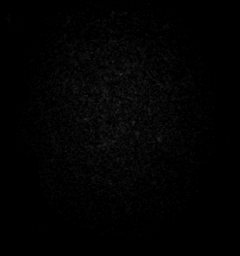
[im 20/80]
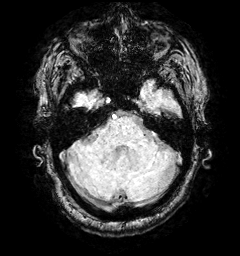
[im 40/80]
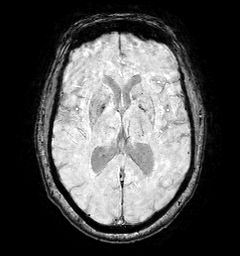
[im 60/80]
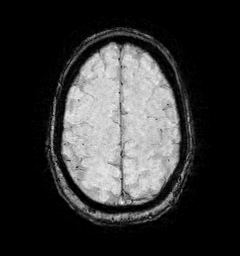
[im 80/80]
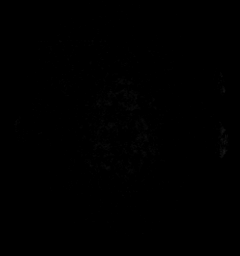

[Series 15: FLAIR · axial · 3.0mm · 0.53mm/px · z∈[-116,+45]mm · 4 of 55 slices shown]
[im 1/55]
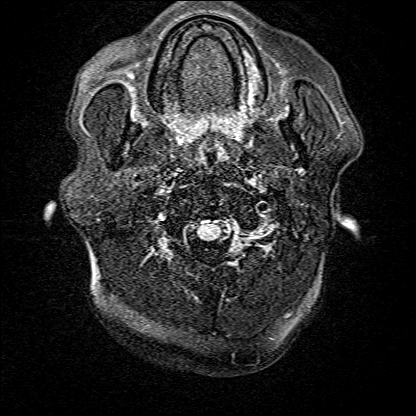
[im 19/55]
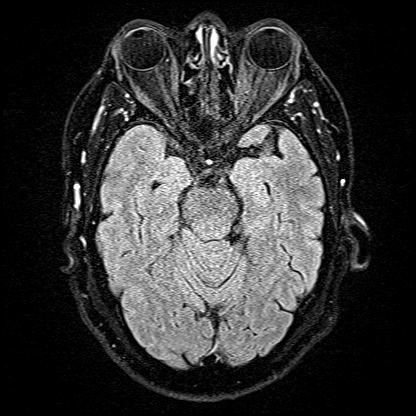
[im 37/55]
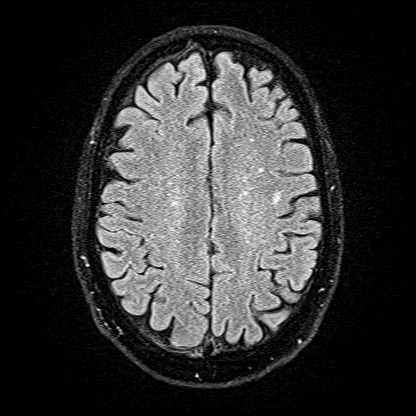
[im 55/55]
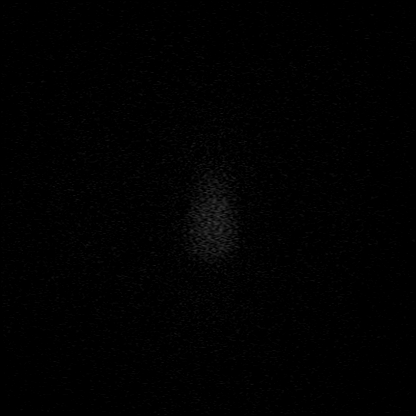

[Series 16: T1 · axial · 1.0mm · 0.98mm/px · z∈[-123,+51]mm · 8 of 176 slices shown (2 of 2)]
[im 1/176]
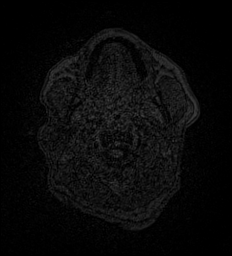
[im 32/176]
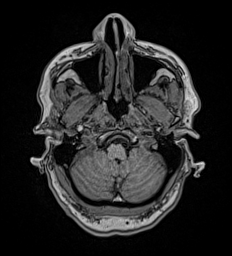
[im 48/176]
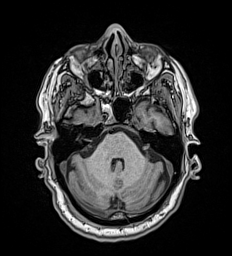
[im 80/176]
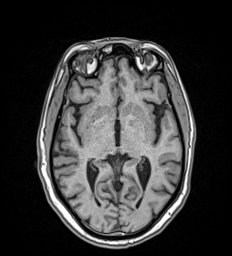
[im 96/176]
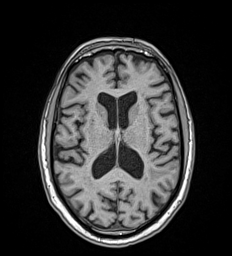
[im 128/176]
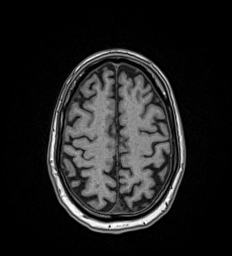
[im 144/176]
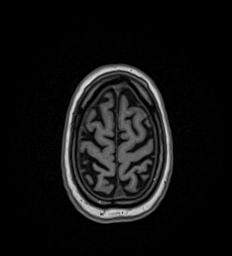
[im 176/176]
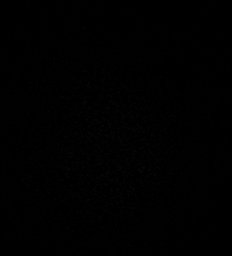

[Series 17: T2 · coronal · 5.0mm · 0.69mm/px · 2 of 31 slices shown (2 of 2)]
[im 1/31]
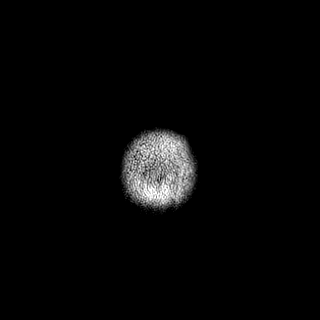
[im 31/31]
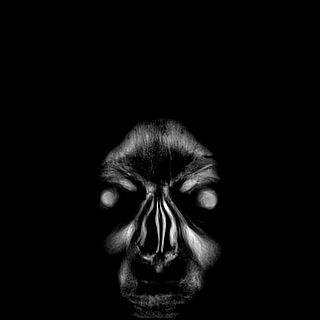

[Series 18: t2_space_tra_(id)_iso · axial · 0.6mm · 0.30mm/px · z∈[-104,-93]mm · 2 of 60 slices shown]
[im 1/60]
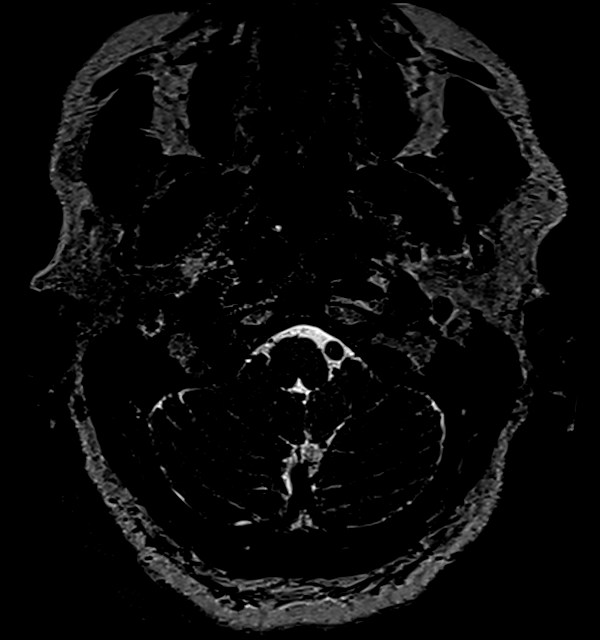
[im 20/60]
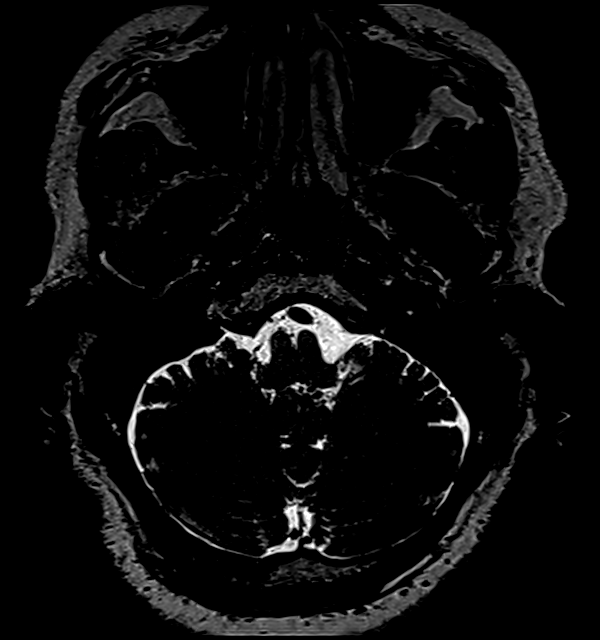

[42 of 48 positions shown; findings below may reference images not displayed]

FINDINGS: Brain: No acute infarction, hemorrhage, hydrocephalus, extra-axial
collection or mass lesion. Similar mild FLAIR hyperintensity in the
cerebral white matter, asymmetric to the left hemisphere and most
likely chronic microvascular ischemic disease given patient age and
mild extent.

Vascular: Major arterial flow voids are maintained skull base.

Skull and upper cervical spine: Normal marrow signal.

Sinuses/Orbits: Clear sinuses.  No acute orbital findings

Other: No mastoid effusions.
IMPRESSION: 1. No evidence of acute intracranial abnormality.
2. Similar mild white matter disease.

## 2022-02-07 MED ORDER — MECLIZINE HCL 25 MG PO TABS: 25.0000 mg | ORAL_TABLET | Freq: Three times a day (TID) | ORAL | 0 refills | Status: AC | PRN

## 2022-02-07 MED ORDER — MECLIZINE HCL 25 MG PO TABS
25.0000 mg | ORAL_TABLET | Freq: Once | ORAL | Status: AC
  Administered 2022-02-07: 25 mg via ORAL
  Filled 2022-02-07: qty 1

## 2022-02-07 NOTE — ED Triage Notes (Signed)
Pt here with dizziness for 3-4 days. Pt stumbling to ED nurses desk. Pt denies fall but states it has gotten worse over time. Pt states he was dx with vertigo. Pt endorses joint pain but denies any other pain. Pt also c/o nausea.

## 2022-02-07 NOTE — ED Notes (Signed)
Patient transported to MRI 

## 2022-02-07 NOTE — ED Provider Notes (Signed)
[facility] Provider Note    Event Date/Time   First MD Initiated Contact with Patient 02/07/22 0840     (approximate)   History   Dizziness   HPI  Aalim Stoffers is a 76 y.o. male with a history of vertigo presents to the ER for evaluation of to 3 days of dizziness feels like the room is spinning whenever he gets up and moves.  Had similar presentation in January of this year but states that this 1 feels different because he is also having some weakness on the left side.  Still able to move his arms thinks that his strength is there but is not sure if it is just numbness or it just feels different.  He denies any pain.  No chest pain or shortness of breath.  States that the symptoms are intermittent.     Physical Exam   Triage Vital Signs: ED Triage Vitals  Enc Vitals Group     BP 02/07/22 0831 130/83     Pulse Rate 02/07/22 0831 72     Resp 02/07/22 0831 19     Temp 02/07/22 0831 98 F (36.7 C)     Temp Source 02/07/22 0831 Oral     SpO2 02/07/22 0831 97 %     Weight 02/07/22 0829 170 lb (77.1 kg)     Height 02/07/22 0829 5\' 7"  (1.702 m)     Head Circumference --      Peak Flow --      Pain Score 02/07/22 0829 0     Pain Loc --      Pain Edu? --      Excl. in GC? --     Most recent vital signs: Vitals:   02/07/22 1000 02/07/22 1030  BP: 127/85 121/86  Pulse: 62 63  Resp: 19 18  Temp:    SpO2: 95% 97%     Constitutional: Alert  Eyes: Conjunctivae are normal.  Head: Atraumatic. Nose: No congestion/rhinnorhea. Mouth/Throat: Mucous membranes are moist.   Neck: Painless ROM.  Cardiovascular:   Good peripheral circulation. Respiratory: Normal respiratory effort.  No retractions.  Gastrointestinal: Soft and nontender.  Musculoskeletal:  no deformity Neurologic:  CN- intact.  No facial droop, Normal FNF.  Normal heel to shin.  Sensation intact bilaterally. Normal speech and language. No gross focal neurologic deficits are appreciated.  No gait instability.  Skin:  Skin is warm, dry and intact. No rash noted. Psychiatric: Mood and affect are normal. Speech and behavior are normal.    ED Results / Procedures / Treatments   Labs (all labs ordered are listed, but only abnormal results are displayed) Labs Reviewed  CBC WITH DIFFERENTIAL/PLATELET - Abnormal; Notable for the following components:      Result Value   Hemoglobin 12.9 (*)    All other components within normal limits  COMPREHENSIVE METABOLIC PANEL - Abnormal; Notable for the following components:   Glucose, Bld 172 (*)    All other components within normal limits  URINALYSIS, ROUTINE W REFLEX MICROSCOPIC - Abnormal; Notable for the following components:   Color, Urine STRAW (*)    APPearance CLEAR (*)    All other components within normal limits     EKG  ED ECG REPORT I, Willy Eddy, the attending physician, personally viewed and interpreted this ECG.   Date: 02/07/2022  EKG Time: 8:30  Rate: 70  Rhythm: sinus  Axis: normal  Intervals:normal qt  ST&T Change: no stemi, no depressions, poor r  wave progression    RADIOLOGY Please see ED Course for my review and interpretation.  I personally reviewed all radiographic images ordered to evaluate for the above acute complaints and reviewed radiology reports and findings.  These findings were personally discussed with the patient.  Please see medical record for radiology report.    PROCEDURES:  Critical Care performed: No  Procedures   MEDICATIONS ORDERED IN ED: Medications  meclizine (ANTIVERT) tablet 25 mg (25 mg Oral Given 02/07/22 0844)     IMPRESSION / MDM / ASSESSMENT AND PLAN / ED COURSE  I reviewed the triage vital signs and the nursing notes.                              Differential diagnosis includes, but is not limited to, cva, tia, hypoglycemia, dehydration, electrolyte abnormality, dissection, sepsis   Patient presented to ER for evaluation of symptoms as  described above.  Given duration will be outside of window for TNK.  Does not have any objective findings.  Possible recurrent vertigo but given new numbness symptoms will order CT head given concern for cva.  This presenting complaint could reflect a potentially life-threatening illness therefore the patient will be placed on continuous pulse oximetry and telemetry for monitoring.  Laboratory evaluation will be sent to evaluate for the above complaints.      Clinical Course as of 02/07/22 1157  Wed Feb 07, 2022  0959 CT imaging on my interpretation shows no evidence of head bleed..  Given symptoms will order MRI. [PR]  [id number] MRI is reassuring without evidence of CVA or mass.  Patient feels significantly improved.  I do believe this is more consistent with peripheral vertigo at this point does appear stable and appropriate for outpatient follow-up [PR]    Clinical Course User Index [PR] Merlyn Lot, MD     FINAL CLINICAL IMPRESSION(S) / ED DIAGNOSES   Final diagnoses:  Dizziness     Rx / DC Orders   ED Discharge Orders          Ordered    meclizine (ANTIVERT) 25 MG tablet  3 times daily PRN        02/07/22 1156             Note:  This document was prepared using Dragon voice recognition software and may include unintentional dictation errors.    Merlyn Lot, MD 02/07/22 1157

## 2022-02-07 NOTE — ED Notes (Signed)
Pt endorses left ear pain and worried about infection. Dizziness and head rush worse with head movement.

## 2022-02-07 NOTE — ED Notes (Signed)
Pt reports he was taking meclizine at home with relief of symptoms.

## 2023-03-10 ENCOUNTER — Emergency Department: Payer: Medicare Other

## 2023-03-10 DIAGNOSIS — S4992XA Unspecified injury of left shoulder and upper arm, initial encounter: Secondary | ICD-10-CM | POA: Insufficient documentation

## 2023-03-10 DIAGNOSIS — W1839XA Other fall on same level, initial encounter: Secondary | ICD-10-CM | POA: Diagnosis not present

## 2023-03-10 NOTE — ED Triage Notes (Signed)
Pt arrived POV for left shoulder pain after an injury on Thursday after his gardening tiller "got away from me and I fell down on my left arm". Denies LOC or hitting his head. Pt reports decrease ROM to his left shoulder. NAD noted, A&O x4. VSS

## 2023-03-11 ENCOUNTER — Emergency Department: Payer: Medicare Other

## 2023-03-11 ENCOUNTER — Emergency Department
Admission: EM | Admit: 2023-03-11 | Discharge: 2023-03-11 | Disposition: A | Discharge status: Home / Self Care | Payer: Medicare Other | Attending: Emergency Medicine | Admitting: Emergency Medicine

## 2023-03-11 DIAGNOSIS — S4992XA Unspecified injury of left shoulder and upper arm, initial encounter: Secondary | ICD-10-CM

## 2023-03-11 DIAGNOSIS — R52 Pain, unspecified: Secondary | ICD-10-CM

## 2023-03-11 MED ORDER — HYDROCODONE-ACETAMINOPHEN 5-325 MG PO TABS: 2.0000 | ORAL_TABLET | Freq: Four times a day (QID) | ORAL | 0 refills | Status: AC | PRN

## 2023-03-11 MED ORDER — HYDROCODONE-ACETAMINOPHEN 5-325 MG PO TABS
2.0000 | ORAL_TABLET | Freq: Once | ORAL | Status: AC
  Administered 2023-03-11: 2 via ORAL
  Filled 2023-03-11: qty 2

## 2023-03-11 NOTE — ED Provider Notes (Signed)
[facility] Provider Note    Event Date/Time   First MD Initiated Contact with Patient 03/11/23 [phone]     (approximate)   History   Shoulder Injury   HPI Alex Lin is a 77 y.o. male who presents for evaluation of pain in his left shoulder after an injury he sustained about 3 days ago.  He said he was using a tiller in the garden when it got away from him and he fell down on the left arm while trying to control the machinery.  He did not feel a pop or have any obvious deformity, but he has trouble lifting his arm now.  He did not strike his head or lose consciousness at the time and has no other injuries.  However the injury to his left shoulder is bad enough that he cannot reach across his body or lift up very far at all.  He has no pain from the elbow down to the hand, just mostly pain in the left shoulder itself.  No numbness nor tingling.     Physical Exam   Triage Vital Signs: ED Triage Vitals  Encounter Vitals Group     BP 03/10/23 2324 119/81     Systolic BP Percentile --      Diastolic BP Percentile --      Pulse Rate 03/10/23 2324 79     Resp 03/10/23 2324 20     Temp 03/10/23 2324 98.4 F (36.9 C)     Temp Source 03/10/23 2324 Oral     SpO2 03/10/23 2324 97 %     Weight 03/10/23 2322 75.8 kg (167 lb)     Height 03/10/23 2322 1.702 m (5\' 7" )     Head Circumference --      Peak Flow --      Pain Score 03/10/23 2322 10     Pain Loc --      Pain Education --      Exclude from Growth Chart --     Most recent vital signs: Vitals:   03/10/23 2324  BP: 119/81  Pulse: 79  Resp: 20  Temp: 98.4 F (36.9 C)  SpO2: 97%    General: Awake, no obvious distress. CV:  Good peripheral perfusion.  Resp:  Normal effort. Speaking easily and comfortably, no accessory muscle usage nor intercostal retractions.  Easily palpable radial pulse in the affected (left) arm. Abd:  No distention.  MSK:  Patient has difficulty raising his left arm.  He  cannot reach up across his body to grasp his right shoulder.  He has severe tenderness to palpation of the left AC joints.  No palpable clavicular deformity or abnormality.   ED Results / Procedures / Treatments   Labs (all labs ordered are listed, but only abnormal results are displayed) Labs Reviewed - No data to display    RADIOLOGY I viewed and interpreted the patient's left shoulder x-rays.  I did not see any specific evidence of fracture or dislocation, but the radiologist was concerned about a possible Hill-Sachs deformity and recommended a CT scan.  I also viewed and interpreted the CT scan and I could not see any abnormalities.  The radiologist agreed that there are no obvious acute abnormalities on the CT scan.   PROCEDURES:  Critical Care performed: No  Procedures    IMPRESSION / MDM / ASSESSMENT AND PLAN / ED COURSE  I reviewed the triage vital signs and the nursing notes.  Differential diagnosis includes, but is not limited to, fracture, dislocation, AC joint separation, shoulder subluxation.  Patient's presentation is most consistent with acute complicated illness / injury requiring diagnostic workup.  Labs/studies ordered: Left shoulder x-rays, left shoulder CT  Interventions/Medications given:  Medications  HYDROcodone-acetaminophen (NORCO/VICODIN) 5-325 MG per tablet 2 tablet (2 tablets Oral Given 03/11/23 0330)    (Note:  hospital course my include additional interventions and/or labs/studies not listed above.)   Initial x-ray was nondiagnostic but the radiologist recommended a CT scan for better characterization of the injury.  As documented above the CT scan was relatively reassuring.  The patient is still having a substantial amount of pain with range of motion of the left shoulder so I ordered a shoulder immobilizer/sling and encouraged close follow-up with orthopedics.  I checked the [place] controlled substance  database and verified that he does not have any concerning prescribing habits and I wrote him a prescription as listed below.  He understands and needs follow-up.         FINAL CLINICAL IMPRESSION(S) / ED DIAGNOSES   Final diagnoses:  Injury of left shoulder, initial encounter  Acute pain     Rx / DC Orders   ED Discharge Orders          Ordered    HYDROcodone-acetaminophen (NORCO/VICODIN) 5-325 MG tablet  Every 6 hours PRN        03/11/23 0316             Note:  This document was prepared using Dragon voice recognition software and may include unintentional dictation errors.   Loleta Rose, MD 03/11/23 [phone]

## 2023-03-11 NOTE — Discharge Instructions (Signed)
We believe you most likely have caused a soft tissue or cartilage injury to your shoulder.  Please use the provided sling as needed.  Use over-the-counter ibuprofen 600 mg 3 times a day with meals as well as Tylenol according label instructions.  You need to follow-up with an orthopedic surgeon and we encourage you to call the number for Dr. Allena Katz to schedule an appointment with him or one of his colleagues at the next available opportunity.  Take Norco as prescribed for severe pain. Do not drink alcohol, drive or participate in any other potentially dangerous activities while taking this medication as it may make you sleepy. Do not take this medication with any other sedating medications, either prescription or over-the-counter. If you were prescribed Percocet or Vicodin, do not take these with acetaminophen (Tylenol) as it is already contained within these medications.   This medication is an opiate (or narcotic) pain medication and can be habit forming.  Use it as little as possible to achieve adequate pain control.  Do not use or use it with extreme caution if you have a history of opiate abuse or dependence.  If you are on a pain contract with your primary care doctor or a pain specialist, be sure to let them know you were prescribed this medication today from the [facility].  This medication is intended for your use only - do not give any to anyone else and keep it in a secure place where nobody else, especially children, have access to it.  It will also cause or worsen constipation, so you may want to consider taking an over-the-counter stool softener while you are taking this medication.

## 2023-04-19 ENCOUNTER — Ambulatory Visit (INDEPENDENT_AMBULATORY_CARE_PROVIDER_SITE_OTHER): Payer: Medicare Other

## 2023-04-19 ENCOUNTER — Ambulatory Visit
Admission: EM | Admit: 2023-04-19 | Discharge: 2023-04-19 | Disposition: A | Discharge status: Home / Self Care | Payer: Medicare Other

## 2023-04-19 DIAGNOSIS — M25572 Pain in left ankle and joints of left foot: Secondary | ICD-10-CM

## 2023-04-19 NOTE — Discharge Instructions (Signed)
The xray is normal.    Follow up with your primary care provider or an orthopedist if your symptoms are not improving.

## 2023-04-19 NOTE — ED Provider Notes (Signed)
Renaldo Fiddler    CSN: [id number] Arrival date & time: 04/19/23  0803      History   Chief Complaint Chief Complaint  Patient presents with   Ankle Pain    HPI Delmore Dion is a 77 y.o. male.  Patient presents with 2-day history of left ankle pain.  No falls or injury.  The pain started when he stood to get up out of bed on Wednesday.  The pain caused him to go down to the floor but he did not fall or have any trauma.  The pain only occurs with weightbearing and ambulation.  No pain at rest.  No numbness, weakness, redness, wounds, bruising, swelling, or other symptoms.  No OTC medications taken today.  Patient is ambulatory with a walker.  His medical history includes diabetes, high cholesterol, GERD, dizziness, anemia, thyroid nodule, lung nodules.  The history is provided by the patient and medical records.    Past Medical History:  Diagnosis Date   Diabetes mellitus without complication (HCC)    GERD (gastroesophageal reflux disease)    High cholesterol     There are no problems to display for this patient.   History reviewed. No pertinent surgical history.     Home Medications    Prior to Admission medications   Medication Sig Start Date End Date Taking? Authorizing Provider  atorvastatin (LIPITOR) 80 MG tablet Take 80 mg by mouth daily.   Yes [provider]  HYDROcodone-acetaminophen (NORCO/VICODIN) 5-325 MG tablet Take 2 tablets by mouth every 6 (six) hours as needed for moderate pain or severe pain. 03/11/23  Yes Loleta Rose, MD  losartan (COZAAR) 25 MG tablet Take by mouth. 08/28/22  Yes [provider]  Multiple Vitamin (MULTIVITAMIN WITH MINERALS) TABS tablet Take 1 tablet by mouth daily.   Yes [provider]  omeprazole (PRILOSEC) 20 MG capsule Take 20 mg by mouth daily.   Yes [provider]  senna-docusate (SENOKOT-S) 8.6-50 MG tablet Take by mouth. 08/28/22  Yes [provider]  meclizine (ANTIVERT) 25  MG tablet Take 1 tablet (25 mg total) by mouth 3 (three) times daily as needed for dizziness. 02/07/22   Willy Eddy, MD    Family History No family history on file.  Social History Social History   Tobacco Use   Smoking status: Never   Smokeless tobacco: Never  Vaping Use   Vaping status: Never Used  Substance Use Topics   Alcohol use: Not Currently   Drug use: Not Currently     Allergies   Oxycodone   Review of Systems Review of Systems  Constitutional:  Negative for chills and fever.  Respiratory:  Negative for cough and shortness of breath.   Musculoskeletal:  Positive for arthralgias and gait problem. Negative for joint swelling and myalgias.  Skin:  Negative for color change, rash and wound.  Neurological:  Negative for weakness and numbness.     Physical Exam Triage Vital Signs ED Triage Vitals  Encounter Vitals Group     BP      Systolic BP Percentile      Diastolic BP Percentile      Pulse      Resp      Temp      Temp src      SpO2      Weight      Height      Head Circumference      Peak Flow      Pain Score  Pain Loc      Pain Education      Exclude from Growth Chart    No data found.  Updated Vital Signs BP 135/80 (BP Location: Left Arm)   Pulse 82   Temp 98.6 F (37 C)   Resp 18   SpO2 98%   Visual Acuity Right Eye Distance:   Left Eye Distance:   Bilateral Distance:    Right Eye Near:   Left Eye Near:    Bilateral Near:     Physical Exam Vitals and nursing note reviewed.  Constitutional:      General: He is not in acute distress.    Appearance: Normal appearance. He is well-developed.  HENT:     Mouth/Throat:     Mouth: Mucous membranes are moist.  Cardiovascular:     Rate and Rhythm: Normal rate and regular rhythm.  Pulmonary:     Effort: Pulmonary effort is normal. No respiratory distress.  Musculoskeletal:        General: Tenderness present. No swelling, deformity or signs of injury.     Cervical back:  Neck supple.       Legs:     Comments: No calf tenderness or edema.  Skin:    General: Skin is warm and dry.     Capillary Refill: Capillary refill takes less than 2 seconds.     Findings: No bruising, erythema, lesion or rash.  Neurological:     General: No focal deficit present.     Mental Status: He is alert and oriented to person, place, and time.     Sensory: No sensory deficit.     Motor: No weakness.     Gait: Gait abnormal.     Comments: Ambulatory with walker      UC Treatments / Results  Labs (all labs ordered are listed, but only abnormal results are displayed) Labs Reviewed - No data to display  EKG   Radiology DG Ankle Complete Left  Result Date: 04/19/2023 CLINICAL DATA:  Left ankle pain and swelling for 2 days without known injury. EXAM: LEFT ANKLE COMPLETE - 3+ VIEW COMPARISON:  None Available. FINDINGS: There is no evidence of fracture, dislocation, or joint effusion. There is no evidence of arthropathy or other focal bone abnormality. Soft tissues are unremarkable. IMPRESSION: Negative. Electronically Signed   By: Lupita Raider M.D.   On: 04/19/2023 08:43    Procedures Procedures (including critical care time)  Medications Ordered in UC Medications - No data to display  Initial Impression / Assessment and Plan / UC Course  I have reviewed the triage vital signs and the nursing notes.  Pertinent labs & imaging results that were available during my care of the patient were reviewed by me and considered in my medical decision making (see chart for details).   Left ankle pain.  X-ray negative.  Treating with Ace wrap, rest, elevation, ice packs as needed.  Instructed him to follow-up with his PCP or an orthopedist if his symptoms are not improving.  Education provided on ankle pain.  He agrees to plan of care.    Final Clinical Impressions(s) / UC Diagnoses   Final diagnoses:  Acute left ankle pain     Discharge Instructions      The xray is  normal.    Follow up with your primary care provider or an orthopedist if your symptoms are not improving.        ED Prescriptions   None    PDMP  not reviewed this encounter.   Mickie Bail, NP 04/19/23 [phone]

## 2023-04-19 NOTE — ED Triage Notes (Signed)
Pt states he woke up Wednesday and his left ankle was swollen and painful, then he fell when trying to get up. Pt states it is painful to move and walk.

## 2023-10-14 ENCOUNTER — Emergency Department
Admission: EM | Admit: 2023-10-14 | Discharge: 2023-10-14 | Disposition: A | Discharge status: Home / Self Care | Payer: Medicare Other | Attending: Emergency Medicine | Admitting: Emergency Medicine

## 2023-10-14 ENCOUNTER — Other Ambulatory Visit: Payer: Self-pay

## 2023-10-14 ENCOUNTER — Emergency Department: Payer: Medicare Other

## 2023-10-14 DIAGNOSIS — E119 Type 2 diabetes mellitus without complications: Secondary | ICD-10-CM | POA: Diagnosis not present

## 2023-10-14 DIAGNOSIS — R42 Dizziness and giddiness: Secondary | ICD-10-CM | POA: Insufficient documentation

## 2023-10-14 DIAGNOSIS — R519 Headache, unspecified: Secondary | ICD-10-CM | POA: Insufficient documentation

## 2023-10-14 LAB — CBC
HCT: 39.8 % (ref 39.0–52.0)
Hemoglobin: 13.2 g/dL (ref 13.0–17.0)
MCH: 31.2 pg (ref 26.0–34.0)
MCHC: 33.2 g/dL (ref 30.0–36.0)
MCV: 94.1 fL (ref 80.0–100.0)
Platelets: 245 10*3/uL (ref 150–400)
RBC: 4.23 MIL/uL (ref 4.22–5.81)
RDW: 13.9 % (ref 11.5–15.5)
WBC: 5.8 10*3/uL (ref 4.0–10.5)
nRBC: 0 % (ref 0.0–0.2)

## 2023-10-14 LAB — URINALYSIS, ROUTINE W REFLEX MICROSCOPIC
Bilirubin Urine: NEGATIVE
Glucose, UA: NEGATIVE mg/dL
Hgb urine dipstick: NEGATIVE
Ketones, ur: NEGATIVE mg/dL
Leukocytes,Ua: NEGATIVE
Nitrite: NEGATIVE
Protein, ur: NEGATIVE mg/dL
Specific Gravity, Urine: 1.004 — ABNORMAL LOW (ref 1.005–1.030)
pH: 6 (ref 5.0–8.0)

## 2023-10-14 LAB — BASIC METABOLIC PANEL
Anion gap: 9 (ref 5–15)
BUN: 12 mg/dL (ref 8–23)
CO2: 23 mmol/L (ref 22–32)
Calcium: 9.4 mg/dL (ref 8.9–10.3)
Chloride: 109 mmol/L (ref 98–111)
Creatinine, Ser: 1.05 mg/dL (ref 0.61–1.24)
GFR, Estimated: >60 mL/min (ref >60)
Glucose, Bld: 143 mg/dL — ABNORMAL HIGH (ref 70–99)
Potassium: 3.7 mmol/L (ref 3.5–5.1)
Sodium: 141 mmol/L (ref 135–145)

## 2023-10-14 LAB — TROPONIN I (HIGH SENSITIVITY)
Troponin I (High Sensitivity): 5 ng/L (ref <18)
Troponin I (High Sensitivity): 4 ng/L (ref <18)

## 2023-10-14 MED ORDER — MECLIZINE HCL 25 MG PO TABS
25.0000 mg | ORAL_TABLET | Freq: Once | ORAL | Status: AC
  Administered 2023-10-14: 25 mg via ORAL
  Filled 2023-10-14: qty 1

## 2023-10-14 MED ORDER — LACTATED RINGERS IV BOLUS
1000.0000 mL | Freq: Once | INTRAVENOUS | Status: AC
  Administered 2023-10-14: 1000 mL via INTRAVENOUS

## 2023-10-14 MED ORDER — MECLIZINE HCL 25 MG PO TABS: 25.0000 mg | ORAL_TABLET | Freq: Three times a day (TID) | ORAL | 0 refills | Status: AC | PRN

## 2023-10-14 NOTE — ED Triage Notes (Signed)
 Pt comes with c/o dizziness since last week. Pt states hx of vertigo. Pt states headache that hasn't subsided. Pt is on meds for vertigo but currently out.

## 2023-10-14 NOTE — ED Provider Notes (Signed)
  Physical Exam  BP 137/74 (BP Location: Left Arm)   Pulse 63   Temp 98.2 F (36.8 C) (Oral)   Resp 17   Ht 5\' 7"  (1.702 m)   Wt 72.6 kg   SpO2 98%   BMI 25.06 kg/m   Physical Exam  Procedures  Procedures  ED Course / MDM   Clinical Course as of 10/14/23 1630  Mon Oct 14, 2023  1226 Independent review of labs, electrolytes not severely deranged, creatinine is normal, no leukocytosis, initial troponin is negative. [TT]  1407 Urinalysis, Routine w reflex microscopic -Urine, Random(!) UA not consistent with UTI.  Troponin x 2 is negative. [TT]    Clinical Course User Index [TT] Jodie Echevaria Franchot Erichsen, MD   Medical Decision Making Amount and/or Complexity of Data Reviewed Labs: ordered. Decision-making details documented in ED Course. Radiology: ordered.   Received patient in signout.  78 year old male presenting today for dizziness symptoms most pronounced with positional changes of his head.  Seen by initial provider with some improvement on meclizine.  MRI was ordered to rule out CVA.  MRI with no evidence of acute CVA.  Patient was feeling better following second dose of meclizine.  He feels comfortable with discharge and as needed meclizine at home.  Told to follow-up with PCP in 1 week for reevaluation.  Given strict return precautions and he is agreeable with plan.     Janith Lima, MD 10/14/23 [phone]

## 2023-10-14 NOTE — ED Provider Notes (Signed)
 Alex Lin Provider Note    Event Date/Time   First MD Initiated Contact with Patient 10/14/23 1211     (approximate)   History   Dizziness   HPI  Alex Lin is a 78 y.o. male with history of diabetes, GERD, hyperlipidemia presenting with dizziness.  Symptoms started last week.  States is worse when he is turning his head to the left and when he is standing.  No weakness, numbness, trauma or falls, slurred speech, vision changes.  States that he also has a headache which she does get with his dizziness episodes in the past.  States that he also feels very unsteady when he walks.  Dizziness is intermittent.  He said there was 1 episode where he felt like he was in a pass out.  No chest pain or shortness of breath, no urinary symptoms, nausea, vomiting, diarrhea, cough.  On independent chart review he had prior MRIs done in 2023 that did not show an acute stroke.     Physical Exam   Triage Vital Signs: ED Triage Vitals  Encounter Vitals Group     BP 10/14/23 0903 123/74     Systolic BP Percentile --      Diastolic BP Percentile --      Pulse Rate 10/14/23 0903 85     Resp 10/14/23 0903 18     Temp 10/14/23 0903 98 F (36.7 C)     Temp src --      SpO2 10/14/23 0903 98 %     Weight 10/14/23 0902 160 lb (72.6 kg)     Height 10/14/23 0902 5\' 7"  (1.702 m)     Head Circumference --      Peak Flow --      Pain Score 10/14/23 0902 8     Pain Loc --      Pain Education --      Exclude from Growth Chart --     Most recent vital signs: Vitals:   10/14/23 0903 10/14/23 1257  BP: 123/74 137/74  Pulse: 85 63  Resp: 18 17  Temp: 98 F (36.7 C) 98.2 F (36.8 C)  SpO2: 98% 98%     General: Awake, no distress.  CV:  Good peripheral perfusion.  Resp:  Normal effort.  Abd:  No distention.  Soft nontender Other:  Pupils equal and reactive, extraocular movements are intact, no facial droop, no other cranial nerve deficits, no focal weakness or  numbness, no dysmetria.  He does have unsteady gait when he gets up to walk.   ED Results / Procedures / Treatments   Labs (all labs ordered are listed, but only abnormal results are displayed) Labs Reviewed  BASIC METABOLIC PANEL - Abnormal; Notable for the following components:      Result Value   Glucose, Bld 143 (*)    All other components within normal limits  URINALYSIS, ROUTINE W REFLEX MICROSCOPIC - Abnormal; Notable for the following components:   Color, Urine STRAW (*)    APPearance CLEAR (*)    Specific Gravity, Urine 1.004 (*)    All other components within normal limits  CBC  TROPONIN I (HIGH SENSITIVITY)  TROPONIN I (HIGH SENSITIVITY)     EKG  Sinus rhythm, rate 81, normal QRS, normal QTc, baseline is wandering but no obvious ischemic ST elevation, not significant change compared to prior   PROCEDURES:  Critical Care performed: No  Procedures   MEDICATIONS ORDERED IN ED: Medications  meclizine (ANTIVERT)  tablet 25 mg (25 mg Oral Given 10/14/23 0907)  lactated ringers bolus 1,000 mL (0 mLs Intravenous Stopped 10/14/23 1357)  meclizine (ANTIVERT) tablet 25 mg (25 mg Oral Given 10/14/23 1249)     IMPRESSION / MDM / ASSESSMENT AND PLAN / ED COURSE  I reviewed the triage vital signs and the nursing notes.                              Differential diagnosis includes, but is not limited to, vertigo, electrolyte derangements, CVA, TIA, dehydration, arrhythmia.  Patient states that vertigo is consistent with prior episodes, but he would like to proceed with an MRI to make sure this is not a stroke.  I believe this is reasonable given that he is ataxic on exam.  Labs, EKG, troponin was obtained out of triage.  Will add on some IV fluids as well as additional dose of meclizine.  Patient's presentation is most consistent with acute presentation with potential threat to life or bodily function.  Signed out to oncoming team pending imaging results. If neg and pt is  symptomatically improving, can dc without meclizine and outpatient follow up.   Clinical Course as of 10/14/23 1452  Mon Oct 14, 2023  1226 Independent review of labs, electrolytes not severely deranged, creatinine is normal, no leukocytosis, initial troponin is negative. [TT]  1407 Urinalysis, Routine w reflex microscopic -Urine, Random(!) UA not consistent with UTI.  Troponin x 2 is negative. [TT]    Clinical Course User Index [TT] Jodie Echevaria, Franchot Erichsen, MD     FINAL CLINICAL IMPRESSION(S) / ED DIAGNOSES   Final diagnoses:  Dizziness  Lightheadedness     Rx / DC Orders   ED Discharge Orders     None        Note:  This document was prepared using Dragon voice recognition software and may include unintentional dictation errors.    Claybon Jabs, MD 10/14/23 [phone]

## 2023-10-14 NOTE — ED Provider Triage Note (Signed)
 Emergency Medicine Provider Triage Evaluation Note  Alex Lin , a 78 y.o. male  was evaluated in triage.  Pt complains of dizziness, light headed, hx of same, out of meclizine, denies slurred speech, weakness, cp.  Review of Systems  Positive:  Negative:   Physical Exam  BP 123/74   Pulse 85   Temp 98 F (36.7 C)   Resp 18   Ht 5\' 7"  (1.702 m)   Wt 72.6 kg   SpO2 98%   BMI 25.06 kg/m  Gen:   Awake, no distress   Resp:  Normal effort  MSK:   Moves extremities without difficulty  Other:    Medical Decision Making  Medically screening exam initiated at 9:08 AM.  Appropriate orders placed.  Alex Lin was informed that the remainder of the evaluation will be completed by another provider, this initial triage assessment does not replace that evaluation, and the importance of remaining in the ED until their evaluation is complete.     Alex Ghee, PA-C 10/14/23 [phone]

## 2023-10-14 NOTE — Discharge Instructions (Addendum)
 I have sent antidizziness medication to your pharmacy to help with her vertigo symptoms.  Please follow-up with your primary care provider in a week for reassessment.  Please return for any severe worsening symptoms.  Make sure you are staying hydrated at home as well.

## 2024-09-29 ENCOUNTER — Emergency Department

## 2024-09-29 ENCOUNTER — Other Ambulatory Visit: Payer: Self-pay

## 2024-09-29 ENCOUNTER — Emergency Department: Admission: EM | Admit: 2024-09-29 | Discharge: 2024-09-29 | Disposition: A | Discharge status: Home / Self Care

## 2024-09-29 DIAGNOSIS — R12 Heartburn: Secondary | ICD-10-CM | POA: Insufficient documentation

## 2024-09-29 DIAGNOSIS — R110 Nausea: Secondary | ICD-10-CM | POA: Insufficient documentation

## 2024-09-29 DIAGNOSIS — R1032 Left lower quadrant pain: Secondary | ICD-10-CM | POA: Insufficient documentation

## 2024-09-29 DIAGNOSIS — R1031 Right lower quadrant pain: Secondary | ICD-10-CM | POA: Insufficient documentation

## 2024-09-29 DIAGNOSIS — K5900 Constipation, unspecified: Secondary | ICD-10-CM

## 2024-09-29 DIAGNOSIS — E119 Type 2 diabetes mellitus without complications: Secondary | ICD-10-CM | POA: Insufficient documentation

## 2024-09-29 DIAGNOSIS — R0789 Other chest pain: Secondary | ICD-10-CM

## 2024-09-29 LAB — URINALYSIS, ROUTINE W REFLEX MICROSCOPIC
Bilirubin Urine: NEGATIVE
Glucose, UA: NEGATIVE mg/dL
Hgb urine dipstick: NEGATIVE
Ketones, ur: NEGATIVE mg/dL
Leukocytes,Ua: NEGATIVE
Nitrite: NEGATIVE
Protein, ur: NEGATIVE mg/dL
Specific Gravity, Urine: >1.046 — ABNORMAL HIGH (ref 1.005–1.030)
pH: 5 (ref 5.0–8.0)

## 2024-09-29 LAB — COMPREHENSIVE METABOLIC PANEL WITH GFR
ALT: 15 U/L (ref 0–44)
AST: 20 U/L (ref 15–41)
Albumin: 4.5 g/dL (ref 3.5–5.0)
Alkaline Phosphatase: 89 U/L (ref 38–126)
Anion gap: 14 (ref 5–15)
BUN: 12 mg/dL (ref 8–23)
CO2: 21 mmol/L — ABNORMAL LOW (ref 22–32)
Calcium: 10 mg/dL (ref 8.9–10.3)
Chloride: 106 mmol/L (ref 98–111)
Creatinine, Ser: 1.1 mg/dL (ref 0.61–1.24)
GFR, Estimated: >60 mL/min
Glucose, Bld: 132 mg/dL — ABNORMAL HIGH (ref 70–99)
Potassium: 4.5 mmol/L (ref 3.5–5.1)
Sodium: 141 mmol/L (ref 135–145)
Total Bilirubin: 0.5 mg/dL (ref 0.0–1.2)
Total Protein: 7.8 g/dL (ref 6.5–8.1)

## 2024-09-29 LAB — CBC
HCT: 41.2 % (ref 39.0–52.0)
Hemoglobin: 13 g/dL (ref 13.0–17.0)
MCH: 30.6 pg (ref 26.0–34.0)
MCHC: 31.6 g/dL (ref 30.0–36.0)
MCV: 96.9 fL (ref 80.0–100.0)
Platelets: 308 10*3/uL (ref 150–400)
RBC: 4.25 MIL/uL (ref 4.22–5.81)
RDW: 13.3 % (ref 11.5–15.5)
WBC: 6.9 10*3/uL (ref 4.0–10.5)
nRBC: 0 % (ref 0.0–0.2)

## 2024-09-29 LAB — LIPASE, BLOOD: Lipase: 36 U/L (ref 11–51)

## 2024-09-29 LAB — TROPONIN T, HIGH SENSITIVITY: Troponin T High Sensitivity: 13 ng/L (ref 0–19)

## 2024-09-29 MED ORDER — IOHEXOL 300 MG/ML  SOLN
100.0000 mL | Freq: Once | INTRAMUSCULAR | Status: AC | PRN
  Administered 2024-09-29: 100 mL via INTRAVENOUS

## 2024-09-29 MED ORDER — POLYETHYLENE GLYCOL 3350 17 G PO PACK
17.0000 g | PACK | Freq: Every day | ORAL | Status: DC
  Administered 2024-09-29: 17 g via ORAL
  Filled 2024-09-29: qty 1

## 2024-09-29 MED ORDER — LIDOCAINE VISCOUS HCL 2 % MT SOLN
15.0000 mL | Freq: Once | OROMUCOSAL | Status: AC
  Administered 2024-09-29: 15 mL via ORAL
  Filled 2024-09-29: qty 15

## 2024-09-29 MED ORDER — ALUM & MAG HYDROXIDE-SIMETH 200-200-20 MG/5ML PO SUSP
30.0000 mL | Freq: Once | ORAL | Status: AC
  Administered 2024-09-29: 30 mL via ORAL
  Filled 2024-09-29: qty 30

## 2024-09-29 MED ORDER — POLYETHYLENE GLYCOL 3350 17 G PO PACK: 17.0000 g | PACK | Freq: Every day | ORAL | 0 refills | Status: AC

## 2024-09-29 NOTE — ED Provider Notes (Cosign Needed)
 "  [facility] Provider Note    Event Date/Time   First MD Initiated Contact with Patient 09/29/24 1438     (approximate)   History   Constipation and Heartburn   HPI  Alex Lin is a 79 y.o. male  with a past medical history of GERD, diabetes, high cholesterol presents to the emergency department with burning central chest pain that started 3 days ago. He also reports nausea, lower abdominal pain, 1 bloody stool that happened last Wednesday (1/28) and has not been able to have a BM since then. He describes the blood as streaky. He states he has not urinated today. He denies any urinary symptoms, anal itching or pain, vomiting, diarrhea, SOB. He has been taking his Prilosec at home daily. He denies a Hx of IBD. He does not currently take any constipation meds. Denies hx of abdominal surgeries.  Physical Exam   Triage Vital Signs: ED Triage Vitals  Encounter Vitals Group     BP 09/29/24 1134 98/70     Girls Systolic BP Percentile --      Girls Diastolic BP Percentile --      Boys Systolic BP Percentile --      Boys Diastolic BP Percentile --      Pulse Rate 09/29/24 1134 74     Resp 09/29/24 1134 18     Temp 09/29/24 1134 98.6 F (37 C)     Temp Source 09/29/24 1134 Oral     SpO2 09/29/24 1134 98 %     Weight --      Height --      Head Circumference --      Peak Flow --      Pain Score 09/29/24 1132 10     Pain Loc --      Pain Education --      Exclude from Growth Chart --     Most recent vital signs: Vitals:   09/29/24 1134 09/29/24 1516  BP: 98/70 110/70  Pulse: 74 70  Resp: 18 18  Temp: 98.6 F (37 C) 98 F (36.7 C)  SpO2: 98% 98%    General: Awake, in no acute distress. Appears stated age. CV: Good peripheral perfusion. RRR, 70 bpm. Respiratory:Normal respiratory effort.  No respiratory distress. CTAB. GI: Soft, non-distended, TTP in RLQ and LLQ regions. Skin:Warm, dry, intact.  DRE deferred.  ED Results / Procedures /  Treatments   Labs (all labs ordered are listed, but only abnormal results are displayed) Labs Reviewed  COMPREHENSIVE METABOLIC PANEL WITH GFR - Abnormal; Notable for the following components:      Result Value   CO2 21 (*)    Glucose, Bld 132 (*)    All other components within normal limits  LIPASE, BLOOD  CBC  URINALYSIS, ROUTINE W REFLEX MICROSCOPIC  TROPONIN T, HIGH SENSITIVITY     EKG  NSR Rate: 89 bpm Rhythm: regular Axis: positive P Waves: present before every QRS PR Interval: 174 ms QRS Complex: 94 ms ST Segment: isometric QTc interval: 447 ms T Waves: upright, symmetric No evidence of acute ischemic changes.  RADIOLOGY CXR FINDINGS: Cardiomediastinal silhouette and pulmonary vasculature are within normal limits.   Lungs are clear.   Moderate multilevel degenerative changes of the thoracic spine.   IMPRESSION: No acute cardiopulmonary process.  Abdominal X Ray FINDINGS: The bowel gas pattern is normal. No radio-opaque calculi or other significant radiographic abnormality are seen.   IMPRESSION: Nonobstructive bowel gas pattern.  CT abdomen/pelvis  pending    PROCEDURES:  Critical Care performed: No   Procedures   MEDICATIONS ORDERED IN ED: Medications  polyethylene glycol (MIRALAX  / GLYCOLAX ) packet 17 g (17 g Oral Given 09/29/24 1542)  alum & mag hydroxide-simeth (MAALOX/MYLANTA) 200-200-20 MG/5ML suspension 30 mL (30 mLs Oral Given 09/29/24 1542)    And  lidocaine  (XYLOCAINE ) 2 % viscous mouth solution 15 mL (15 mLs Oral Given 09/29/24 1542)     IMPRESSION / MDM / ASSESSMENT AND PLAN / ED COURSE  I reviewed the triage vital signs and the nursing notes.                              Differential diagnosis includes, but is not limited to, GERD exacerbation, constipation, SBO, appendicitis, IBD, hemorrhoid, anemia  Patient's presentation is most consistent with acute complicated illness / injury requiring diagnostic workup.  79 year old  male presenting with signs and symptoms as described above.  Patient does have some tenderness to palpation of the right and left lower quadrants.  He did not want a DRE performed at this time or assessment of the rectum and anus.  Abdominal and chest x-rays ordered in triage.  No acute findings.  Patient is afebrile.  CBC with no leukocytosis, stable hemoglobin and hematocrit, no evidence of anemia.  CMP unremarkable.  No electrolyte abnormality.  Lipase unremarkable.  Troponin unremarkable, EKG with no acute ischemic findings.  Bladder scan with 3 mL, unremarkable. Patient urinated while in the ER but forgot to provide urine sample.  Given GI cocktail and dose of MiraLAX .  Patient currently waiting on CT abd pelvis after IV team came to insert IV, and UA still needs to be obtained. PA Medford Amber will follow up on results and determine plan of care.    Suspect will need acute increase in Prilosec dosing to 40 mg daily for next few days and OTC constipation medicines, encouragement of increase in mobility/walking and fluid intake. Consider possible GI f/u pending CT scan results.   FINAL CLINICAL IMPRESSION(S) / ED DIAGNOSES   Final diagnoses:  RLQ abdominal pain  LLQ abdominal pain  Burning chest pain     Rx / DC Orders   ED Discharge Orders     None        Note:  This document was prepared using Dragon voice recognition software and may include unintentional dictation errors.     [name], [place] 09/29/24 1821  "

## 2024-09-29 NOTE — ED Notes (Signed)
 See triage note  Presents with some epigastric /heartburn    States this started  a few days ago   Afebrile on arrival Nausea only

## 2024-09-29 NOTE — Discharge Instructions (Signed)
 Please take MiraLAX  daily.  Increase Prilosec to 40 mg daily.  Follow-up PCP for recheck.  Return to the ER if you develop any fevers increasing pain vomiting worsening symptoms or any urgent changes in your health
